# Patient Record
Sex: Female | Born: 1960
Health system: Southern US, Community
[De-identification: ages and names within clinical notes are randomized; demographics above are authoritative.]

## PROBLEM LIST (undated history)

## (undated) DIAGNOSIS — F419 Anxiety disorder, unspecified: Secondary | ICD-10-CM

## (undated) DIAGNOSIS — F909 Attention-deficit hyperactivity disorder, unspecified type: Secondary | ICD-10-CM

## (undated) HISTORY — DX: Anxiety disorder, unspecified: F41.9

## (undated) HISTORY — DX: Attention-deficit hyperactivity disorder, unspecified type: F90.9

---

## 1998-01-22 ENCOUNTER — Other Ambulatory Visit: Admission: RE | Admit: 1998-01-22 | Discharge: 1998-01-22 | Payer: Self-pay | Admitting: *Deleted

## 1998-03-19 ENCOUNTER — Other Ambulatory Visit: Admission: RE | Admit: 1998-03-19 | Discharge: 1998-03-19 | Payer: Self-pay | Admitting: Obstetrics and Gynecology

## 1998-04-09 ENCOUNTER — Inpatient Hospital Stay (HOSPITAL_COMMUNITY): Admission: AD | Admit: 1998-04-09 | Discharge: 1998-04-11 | Payer: Self-pay | Admitting: *Deleted

## 1998-04-24 ENCOUNTER — Encounter (HOSPITAL_COMMUNITY): Admission: RE | Admit: 1998-04-24 | Discharge: 1998-07-23 | Payer: Self-pay | Admitting: *Deleted

## 1998-05-14 ENCOUNTER — Other Ambulatory Visit: Admission: RE | Admit: 1998-05-14 | Discharge: 1998-05-14 | Payer: Self-pay | Admitting: Obstetrics and Gynecology

## 1999-07-13 ENCOUNTER — Other Ambulatory Visit: Admission: RE | Admit: 1999-07-13 | Discharge: 1999-07-13 | Payer: Self-pay | Admitting: Obstetrics and Gynecology

## 2000-02-17 ENCOUNTER — Inpatient Hospital Stay (HOSPITAL_COMMUNITY): Admission: AD | Admit: 2000-02-17 | Discharge: 2000-02-19 | Payer: Self-pay | Admitting: Obstetrics and Gynecology

## 2000-02-17 ENCOUNTER — Inpatient Hospital Stay (HOSPITAL_COMMUNITY): Admission: AD | Admit: 2000-02-17 | Discharge: 2000-02-17 | Payer: Self-pay | Admitting: Obstetrics and Gynecology

## 2000-03-24 ENCOUNTER — Other Ambulatory Visit: Admission: RE | Admit: 2000-03-24 | Discharge: 2000-03-24 | Payer: Self-pay | Admitting: Obstetrics and Gynecology

## 2001-03-28 ENCOUNTER — Other Ambulatory Visit: Admission: RE | Admit: 2001-03-28 | Discharge: 2001-03-28 | Payer: Self-pay | Admitting: Obstetrics and Gynecology

## 2001-04-27 ENCOUNTER — Encounter: Payer: Self-pay | Admitting: Obstetrics and Gynecology

## 2001-04-27 ENCOUNTER — Encounter: Admission: RE | Admit: 2001-04-27 | Discharge: 2001-04-27 | Payer: Self-pay | Admitting: Obstetrics and Gynecology

## 2001-05-03 ENCOUNTER — Other Ambulatory Visit: Admission: RE | Admit: 2001-05-03 | Discharge: 2001-05-03 | Payer: Self-pay | Admitting: Obstetrics and Gynecology

## 2001-05-03 ENCOUNTER — Encounter (INDEPENDENT_AMBULATORY_CARE_PROVIDER_SITE_OTHER): Payer: Self-pay

## 2001-10-24 ENCOUNTER — Other Ambulatory Visit: Admission: RE | Admit: 2001-10-24 | Discharge: 2001-10-24 | Payer: Self-pay | Admitting: Obstetrics and Gynecology

## 2002-07-11 ENCOUNTER — Inpatient Hospital Stay (HOSPITAL_COMMUNITY): Admission: AD | Admit: 2002-07-11 | Discharge: 2002-07-11 | Payer: Self-pay | Admitting: Obstetrics and Gynecology

## 2002-07-15 ENCOUNTER — Inpatient Hospital Stay (HOSPITAL_COMMUNITY): Admission: AD | Admit: 2002-07-15 | Discharge: 2002-07-15 | Payer: Self-pay | Admitting: Obstetrics & Gynecology

## 2002-07-16 ENCOUNTER — Inpatient Hospital Stay (HOSPITAL_COMMUNITY): Admission: AD | Admit: 2002-07-16 | Discharge: 2002-07-18 | Payer: Self-pay | Admitting: *Deleted

## 2002-08-27 ENCOUNTER — Other Ambulatory Visit: Admission: RE | Admit: 2002-08-27 | Discharge: 2002-08-27 | Payer: Self-pay | Admitting: Obstetrics and Gynecology

## 2003-04-10 ENCOUNTER — Other Ambulatory Visit: Admission: RE | Admit: 2003-04-10 | Discharge: 2003-04-10 | Payer: Self-pay | Admitting: Obstetrics and Gynecology

## 2003-09-05 ENCOUNTER — Other Ambulatory Visit: Admission: RE | Admit: 2003-09-05 | Discharge: 2003-09-05 | Payer: Self-pay | Admitting: Obstetrics and Gynecology

## 2004-01-30 ENCOUNTER — Encounter: Admission: RE | Admit: 2004-01-30 | Discharge: 2004-01-30 | Payer: Self-pay | Admitting: Obstetrics and Gynecology

## 2005-05-14 ENCOUNTER — Encounter: Admission: RE | Admit: 2005-05-14 | Discharge: 2005-05-14 | Payer: Self-pay | Admitting: Obstetrics and Gynecology

## 2006-07-15 ENCOUNTER — Encounter: Admission: RE | Admit: 2006-07-15 | Discharge: 2006-07-15 | Payer: Self-pay | Admitting: Obstetrics and Gynecology

## 2007-09-01 ENCOUNTER — Encounter: Admission: RE | Admit: 2007-09-01 | Discharge: 2007-09-01 | Payer: Self-pay | Admitting: Obstetrics and Gynecology

## 2008-06-22 IMAGING — MG MM DIGITAL SCREENING
4 series · 4 of 4 positions shown · non-contrast
Comparison: Prior studies.

DG SCREEN MAMMOGRAM BILATERAL
Bilateral CC and MLO view(s) were taken.
Prior study comparison: July 15, 2006, dG screen mammogram bilateral, performed at [facility] [REDACTED].  May 14, 2005, bilateral screening mammogram, performed at [facility] 
[REDACTED].

DIGITAL SCREENING MAMMOGRAM WITH CAD:

[R CC]
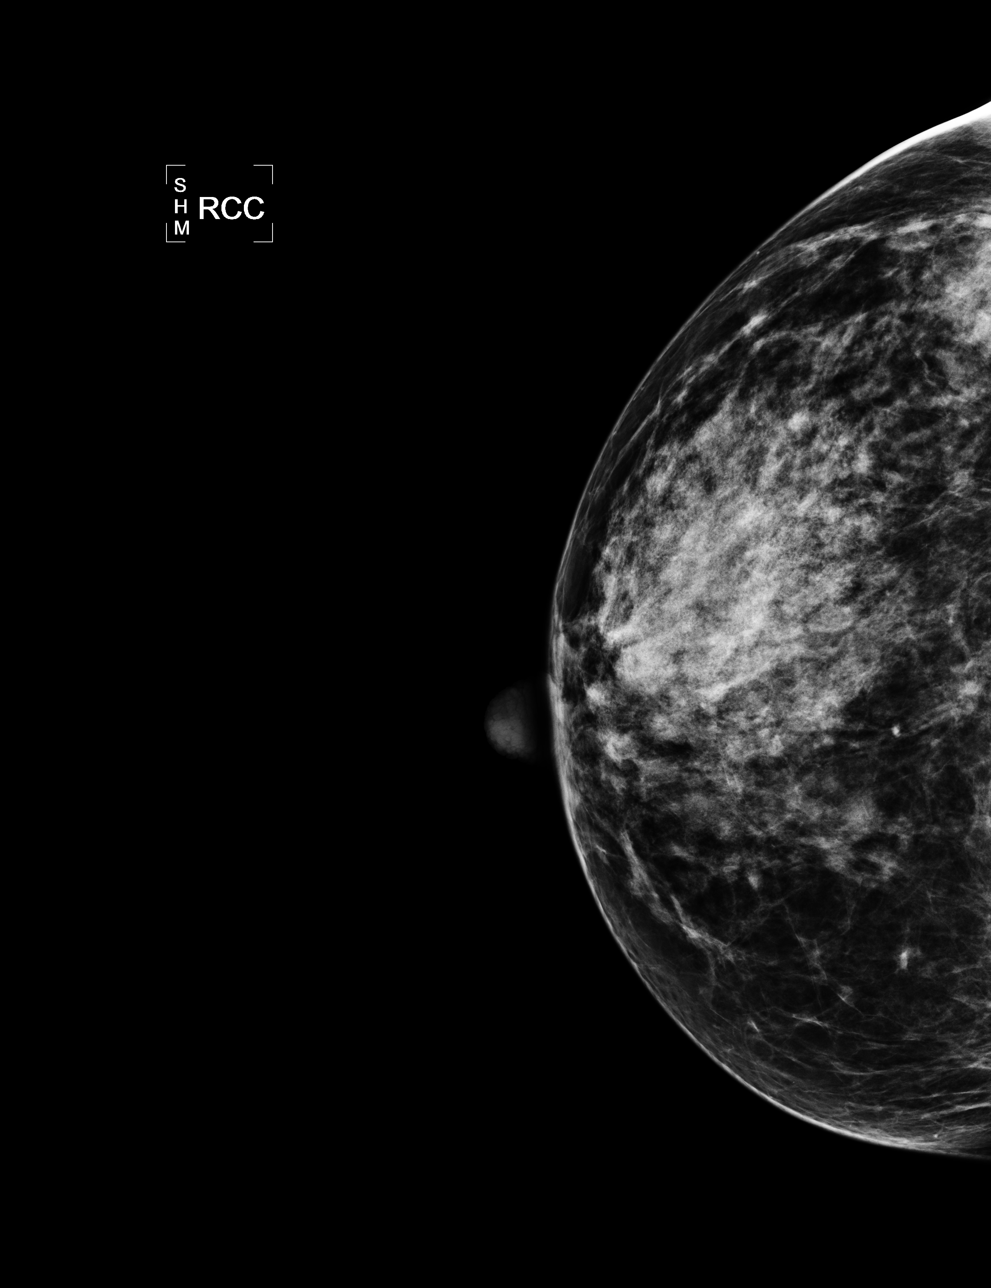

[R MLO]
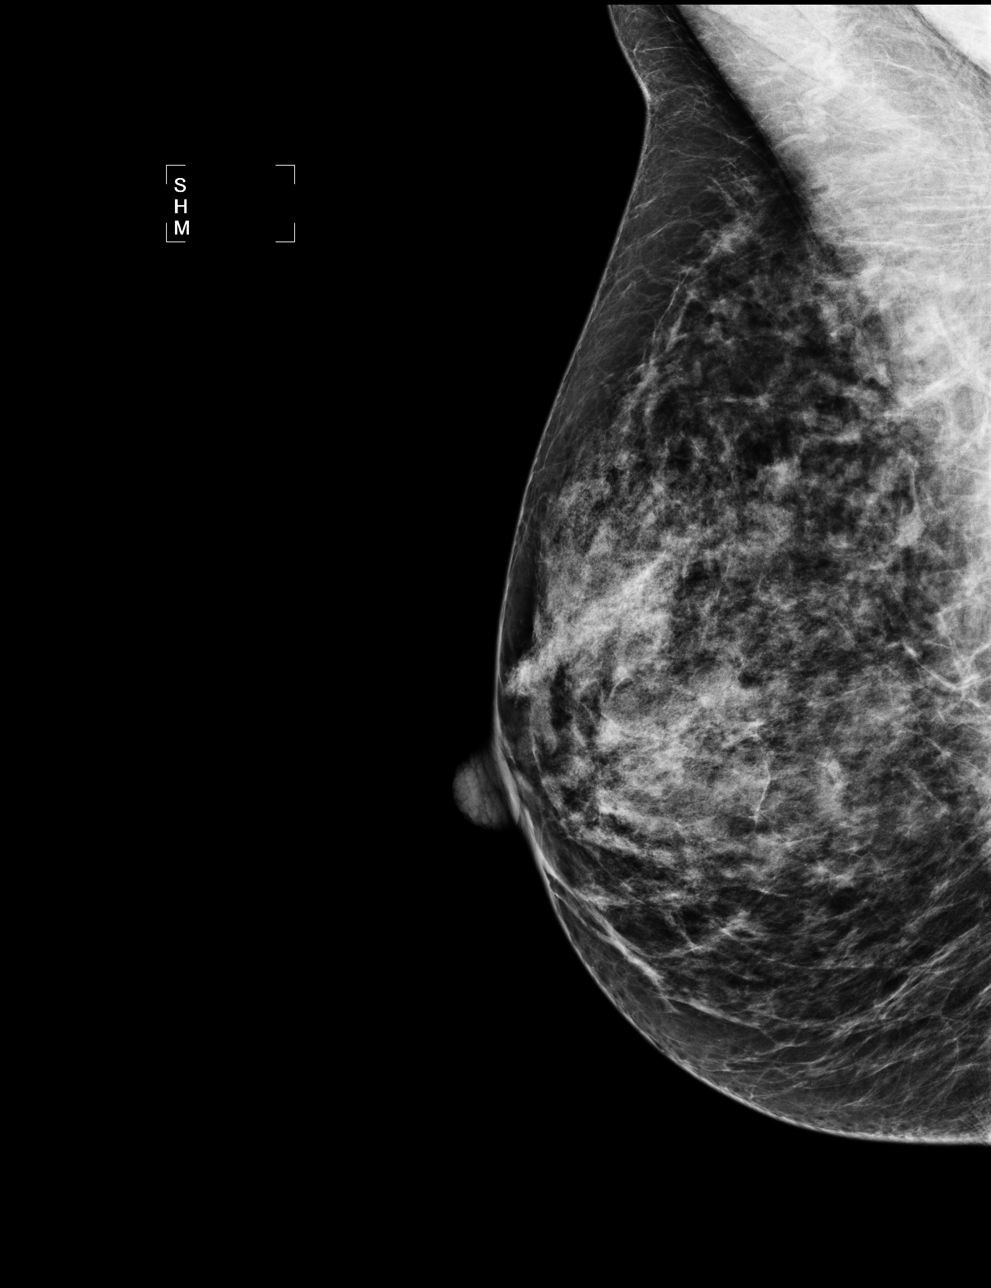

[L CC]
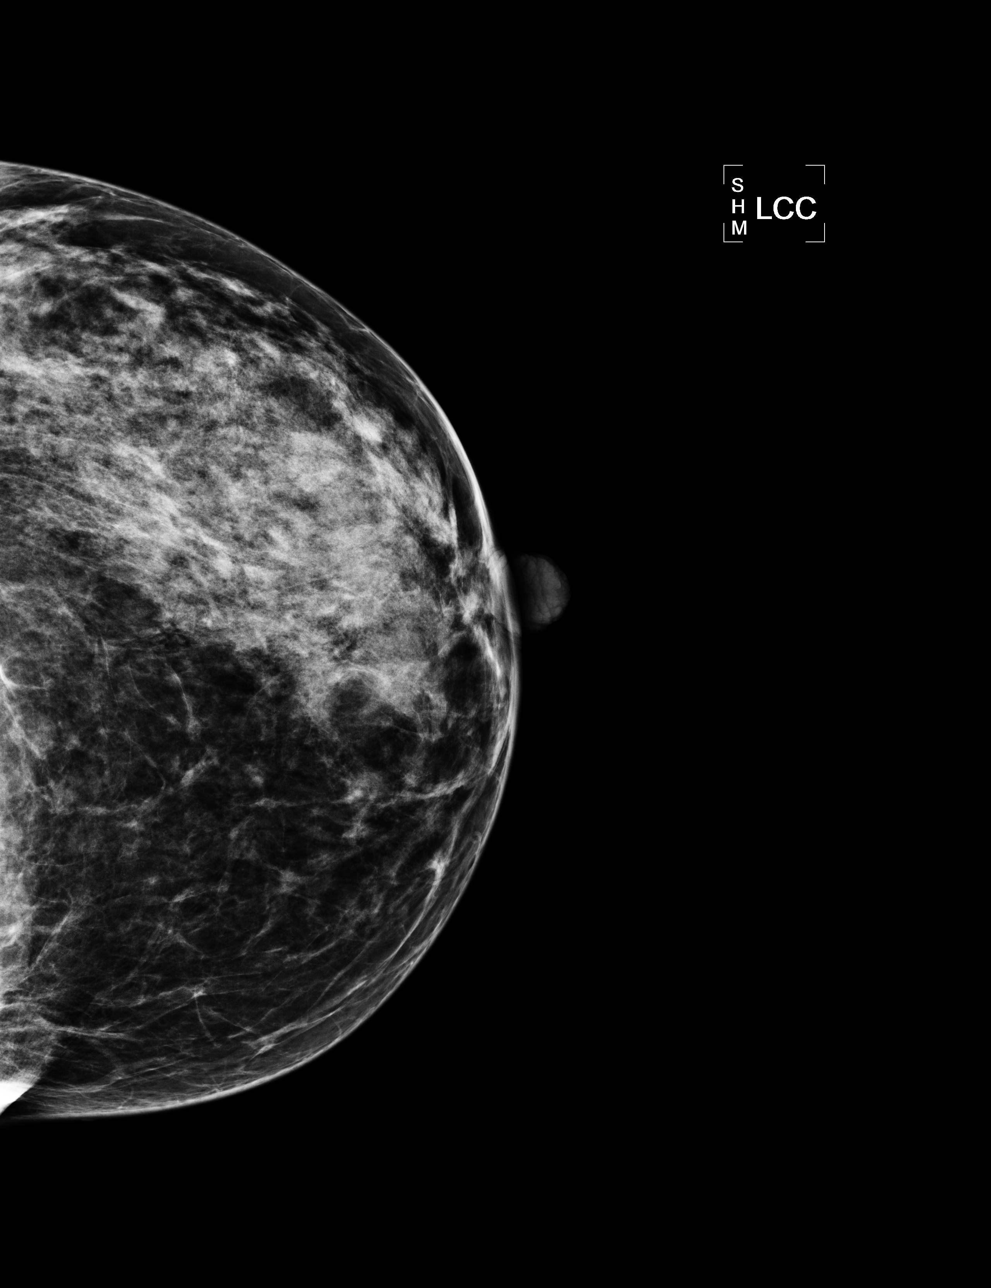

[L MLO]
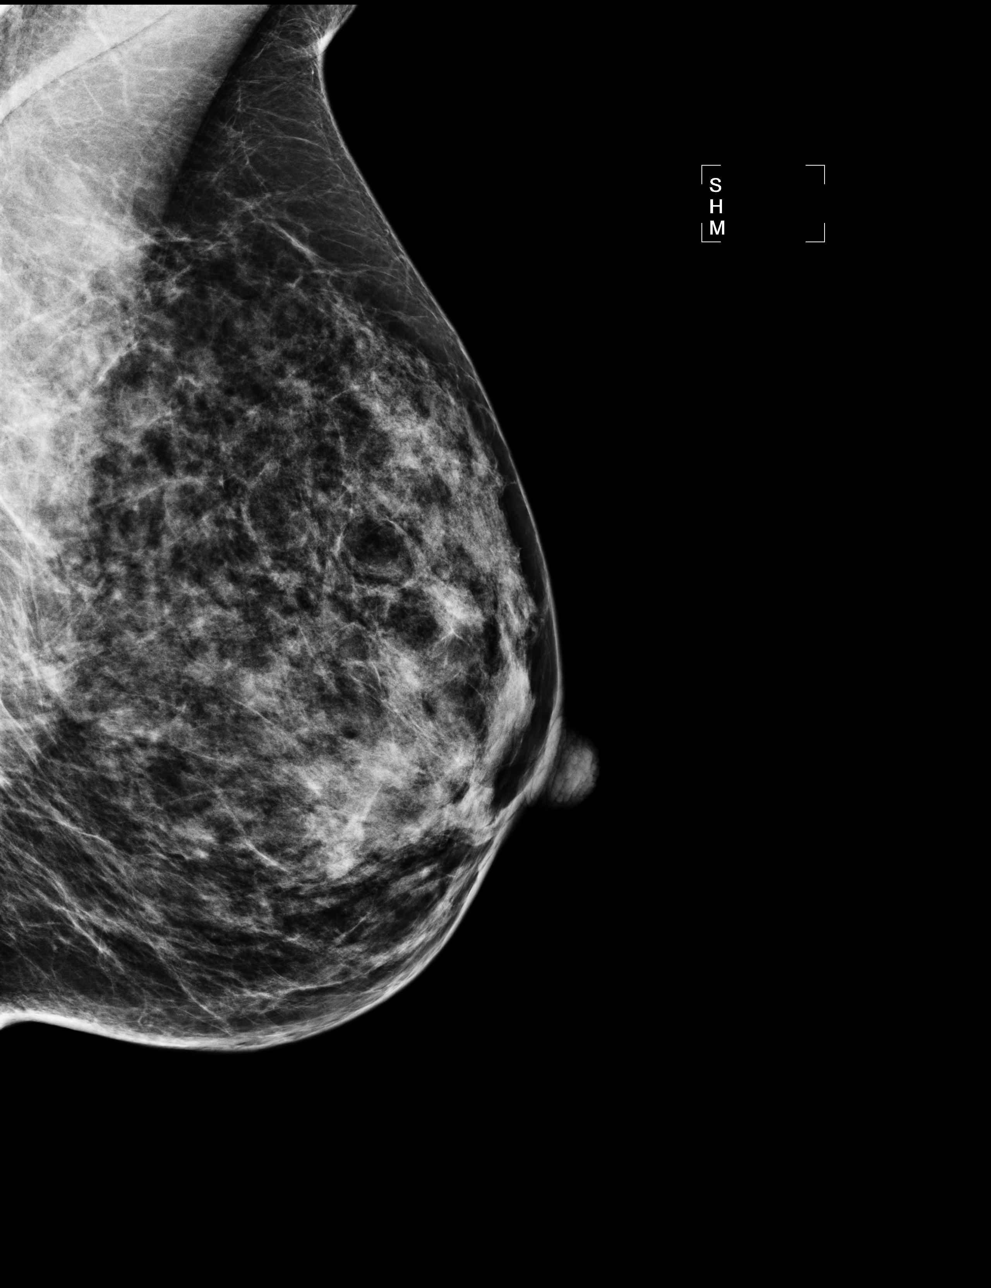

[4 of 4 positions shown; findings below may reference images not displayed]

The breast tissue is heterogeneously dense.  There is no dominant mass, architectural distortion or
calcification to suggest malignancy.
IMPRESSION: No mammographic evidence of malignancy.  Suggest yearly screening mammography.

ASSESSMENT: Negative - BI-RADS 1

Screening mammogram in 1 year.
THIS WAS ANALAYZED BY COMPUTER AIDED DETECTION. , THIS PROCEDURE WAS A DIGITAL MAMMOGRAM.

## 2011-02-17 ENCOUNTER — Ambulatory Visit (HOSPITAL_COMMUNITY): Payer: Self-pay | Admitting: Psychiatry

## 2011-02-24 ENCOUNTER — Ambulatory Visit (HOSPITAL_COMMUNITY): Payer: Self-pay | Admitting: Psychiatry

## 2011-03-05 NOTE — H&P (Signed)
   NAME:  Kayla Sims, Kayla Sims                         ACCOUNT NO.:  [id number]   MEDICAL RECORD NO.:  [id number]                   PATIENT TYPE:  INP   LOCATION:  9173                                 FACILITY:  [facility]   PHYSICIAN:  Lenoard Aden, M.D.             DATE OF BIRTH:  02-Jan-1961   DATE OF ADMISSION:  07/16/2002  DATE OF DISCHARGE:                                HISTORY & PHYSICAL   CHIEF COMPLAINT:  Labor.   HISTORY OF PRESENT ILLNESS:  The patient is a 50 year old white female G3,  P2, EDD July 19, 2002 with regular contractions.   PAST MEDICAL HISTORY:  Remarkable for two uncomplicated vaginal deliveries.   ALLERGIES:  None.   MEDICATIONS:  Prenatal vitamins.   PAST SURGICAL HISTORY:  History of no other medical or surgical  hospitalizations.  History of LEEP for dysplasia in July 2002.   FAMILY HISTORY:  Insulin-dependent diabetes mellitus, leukemia, breast and  uterine cancer.   PRENATAL LABORATORY DATA:  Blood type A+.  Rh antibody negative.  Rubella  immune.  Hepatitis B surface antigen negative.  GBS negative.   PHYSICAL EXAMINATION:  GENERAL:  Well-developed, well-nourished white female  in minimal amount of distress.  HEENT:  Normal.  LUNGS:  Clear.  HEART:  Regular rate and rhythm.  ABDOMEN:  Soft, gravid, nontender.  Estimated fetal weight 8.5 pounds.  PELVIC:  Cervix was 3 cm, 80% vertex, -1/-2.  EXTREMITIES:  No cords.  NEUROLOGIC:  Nonfocal.   IMPRESSION:  Prodromal labor at 39+ weeks.   PLAN:  [facility] for AROM and induction.                                               Lenoard Aden, M.D.    RJT/MEDQ  D:  07/16/2002  T:  07/16/2002  Job:  [id number]

## 2011-03-29 ENCOUNTER — Ambulatory Visit (HOSPITAL_COMMUNITY): Payer: Self-pay | Admitting: Psychiatry

## 2011-04-02 ENCOUNTER — Ambulatory Visit (HOSPITAL_COMMUNITY): Payer: BC Managed Care – PPO | Admitting: Psychiatry

## 2011-04-02 DIAGNOSIS — F988 Other specified behavioral and emotional disorders with onset usually occurring in childhood and adolescence: Secondary | ICD-10-CM

## 2011-04-06 NOTE — Group Therapy Note (Signed)
NAMEAMARIANA, Kayla Sims               ACCOUNT NO.:  [id number]  MEDICAL RECORD NO.:  [id number]  LOCATION:  [facility]                           [facility]ACILITY:  [facility]  PHYSICIAN:  Michelangelo Rindfleisch T. Jabree Rebert, M.D.   DATE OF BIRTH:  11/03/1960                                INITIAL PROGRESS NOTE    The patient is 50 year old Caucasian, employed, married Kayla who is self-referred seeking evaluation and treatment.  The patient endorsed history of ADD symptoms most of her life.  However, in the past few months, she believes that her symptoms are getting worse.  She admitted forgetting things, lack of attention and focus, taking more time to complete the task.  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  She is afraid that she may fail and decided that next semester or class, she will only do the part-time as she cannot accept failure in her current job.  She admitted she was easily distracted and difficulty doing multitasking.  She endorsed that sometimes she has a lot of racing thoughts in her head and she cannot focus on one thing.  That makes her frustrated, tearful and tired.  She denies any depressive thoughts and reported that she has a beautiful family, loving and supporting husband, good support system, and three children.  However, she gets frustrated when things are not done accordingly.  She also endorsed lack of organization to do job and takes more than normal time to finish the task.  She remembered taking Adderall almost 15 years ago through mental health provider.  She took it for some time and then she stopped due to the fact that it caused some hyperactivity.  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  The patient denies any suicidal thinking, mania, hypomania psychosis, violence, aggression or agitation.  PAST PSYCHIATRIC HISTORY: As mentioned above, the patient has history of  seeing a mental health provider almost 15 years ago for the same reason where she was given Adderall but she stopped Adderall due to it making her more hyper.  It did help the focus and attention but she does not want to take anything that can make her more hyper.  She also has taken Xanax 10 years ago for anxiety but currently she is not taking any psychotropic medication. She admitted she has difficulty with attention and concentration in the school but she was able to manage good scores with hard work and putting more hours and studying.  FAMILY HISTORY: The patient believed that her mother and her brother may have ADD symptoms.   PSYCHOSOCIAL HISTORY: The patient was born in [place] and  lived in [place] area until age 10 when she moved to [place].  She has been married for 15 years; she has three children; two sons and one daughter. Her husband has been very supportive, however, busy in his job.  The patient denies any history of physical, sexual, verbal or emotional abuse.  EDUCATION BACKGROUND AND WORK HISTORY: Patient has BS in sociology and bachelor's in education from [employer].  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 Alcohol and substance abuse history.  The patient does admit history of one to two glasses of wine on a regular basis.  She has been drinking for the past 15 years.  However, she denies any history of withdrawal symptoms, blackouts, seizures or any intoxication.  She admitted that she drinks to calm herself the night when she is tired of thinking too much.  She denies any other illegal substances.  MEDICAL HISTORY: The patient has history of anemia.  She sees Dr. Lum [facility] at [facility].  However, she currently is not taking any prescribed medication.  She takes iron over-the-counter.  Her weight today was 131 pounds.  MENTAL STATUS EXAM: The patient is a middle-aged Kayla who is groomed,  well dressed and maintained good eye contact.  She appears very anxious but overall relevant and pleasant.  Her speech is soft, clear and coherent.  Her thought processes are also linear, logical and goal directed.  At times she has difficulty paying attention in the conversation and she keeps reading her book.  Tries to address her symptoms.  She denies any auditory hallucinations, suicidal thoughts or homicidal thoughts.  She is alert and oriented x3 but, as mentioned above, she has difficulty at times with attention and concentration.  There was no psychosis present. She also has some difficulty recalling old events but overall her memory was okay.  She is alert and oriented x3.  Her insight, judgment, pulse control were okay.  DIAGNOSES: AXIS I:  Attention deficit disorder, anxiety disorder NOS. AXIS II:  Deferred. AXIS III:  See medical history. AXIS IV:  Mild. AXIS V:  70-75.  PLAN: I talked to the patient at length about stimulants and medicine for ADD. We also talked at length about continued use of alcohol and interaction with ADD medication.  After some discussion, the patient agreed to stop the alcohol to see if any of the ADD medicine can help her symptoms. Due to the fact that she had tried Adderall in the past that makes her hyper, we discussed to try Strattera in a small dose.  I have explained in length the risks and benefits of medication.  She has agreed to try Strattera in a smaller dose 18 mg for 2 weeks and then she will take 25 mg daily.  She will stop drinking alcohol for now to see if the medicine helps her ADD symptoms.  Will also get collateral information from her primary care doctor for any labs if it has been done in the past few months.  I recommended to give [place] a call if she has any question or any concern about the medication.  I will see her again in 2 weeks.     Kayla Sims T. Kayla Sims, M.D.     STA/MEDQ  D:  04/02/2011  T:  04/02/2011  Job:   [id number]  Electronically Signed by Kathryne Sharper M.D. on 04/06/2011 01:59:14 PM

## 2011-05-03 ENCOUNTER — Encounter (HOSPITAL_COMMUNITY): Payer: BC Managed Care – PPO | Admitting: Psychiatry

## 2011-05-03 DIAGNOSIS — F988 Other specified behavioral and emotional disorders with onset usually occurring in childhood and adolescence: Secondary | ICD-10-CM

## 2011-05-10 ENCOUNTER — Encounter (HOSPITAL_COMMUNITY): Payer: BC Managed Care – PPO | Admitting: Psychiatry

## 2011-05-19 ENCOUNTER — Encounter (HOSPITAL_COMMUNITY): Payer: BC Managed Care – PPO | Admitting: Psychiatry

## 2011-09-27 ENCOUNTER — Ambulatory Visit: Payer: BC Managed Care – PPO | Admitting: Family Medicine

## 2011-11-23 ENCOUNTER — Ambulatory Visit (INDEPENDENT_AMBULATORY_CARE_PROVIDER_SITE_OTHER): Payer: BC Managed Care – PPO | Admitting: Family Medicine

## 2011-11-23 ENCOUNTER — Encounter: Payer: Self-pay | Admitting: Family Medicine

## 2011-11-23 VITALS — BP 110/60 | HR 63 | Temp 97.8°F | Ht 67.0 in | Wt 135.4 lb

## 2011-11-23 DIAGNOSIS — Z1211 Encounter for screening for malignant neoplasm of colon: Secondary | ICD-10-CM

## 2011-11-23 DIAGNOSIS — F341 Dysthymic disorder: Secondary | ICD-10-CM

## 2011-11-23 DIAGNOSIS — F418 Other specified anxiety disorders: Secondary | ICD-10-CM

## 2011-11-23 MED ORDER — FLUOXETINE HCL 10 MG PO CAPS: 10.0000 mg | ORAL_CAPSULE | Freq: Every day | ORAL | Status: DC

## 2011-11-23 NOTE — Patient Instructions (Signed)
It was wonderful to meet you. Please call in 3-4 weeks with an update of your symptoms.    Fluoxetine capsules or tablets (Depression/Mood Disorders) What is this medicine? FLUOXETINE (floo OX e teen) belongs to a class of drugs known as selective serotonin reuptake inhibitors (SSRIs). It helps to treat mood problems such as depression, obsessive compulsive disorder, and panic attacks. It can also treat certain eating disorders. This medicine may be used for other purposes; ask your health care provider or pharmacist if you have questions. What should I tell my health care provider before I take this medicine? They need to know if you have any of these conditions: -bipolar disorder or mania -diabetes -glaucoma -liver disease -psychosis -seizures -suicidal thoughts or history of attempted suicide -an unusual or allergic reaction to fluoxetine, other medicines, foods, dyes, or preservatives -pregnant or trying to get pregnant -breast-feeding How should I use this medicine? Take this medicine by mouth with a glass of water. Follow the directions on the prescription label. You can take this medicine with or without food. Take your medicine at regular intervals. Do not take it more often than directed. Do not stop taking except on your doctor's advice. A special MedGuide will be given to you by the pharmacist with each prescription and refill. Be sure to read this information carefully each time. Talk to your pediatrician regarding the use of this medicine in children. While this drug may be prescribed for children as young as 7 years for selected conditions, precautions do apply. Overdosage: If you think you have taken too much of this medicine contact a poison control center or emergency room at once. NOTE: This medicine is only for you. Do not share this medicine with others. What if I miss a dose? If you miss a dose, skip the missed dose and go back to your regular dosing schedule. Do not  take double or extra doses. What may interact with this medicine? Do not take fluoxetine with any of the following medications: -other medicines containing fluoxetine, like Sarafem or Symbyax -certain diet drugs like dexfenfluramine, fenfluramine, phentermine -cisapride -linezolid -medicines called MAO Inhibitors like Azilect, Carbex, Eldepryl, Marplan, Nardil, and Parnate -methylene blue -pimozide -procarbazine -thioridazine -tryptophan Fluoxetine may also interact with the following medications: -alcohol -any other medicines for depression, anxiety, or psychotic disturbances -aspirin and aspirin-like medicines -carbamazepine -cyproheptadine -dextromethorphan -flecainide -lithium -medicines for diabetes -medicines for migraine headache, like sumatriptan -medicines for sleep -medicines that treat or prevent blood clots like warfarin, enoxaparin, and dalteparin -metoprolol -NSAIDs, medicines for pain and inflammation, like ibuprofen or naproxen -phenytoin -propafenone -propranolol -St. John's wort -vinblastine This list may not describe all possible interactions. Give your health care provider a list of all the medicines, herbs, non-prescription drugs, or dietary supplements you use. Also tell them if you smoke, drink alcohol, or use illegal drugs. Some items may interact with your medicine. What should I watch for while using this medicine? Visit your doctor or health care professional for regular checks on your progress. Continue to take your medicine even if you do not immediately feel better. It can take several weeks before you notice the full effect of this medicine. Patients and their families should watch out for worsening depression or thoughts of suicide. Also watch out for any sudden or severe changes in feelings such as feeling anxious, agitated, panicky, irritable, hostile, aggressive, impulsive, severely restless, overly excited and hyperactive, or not being able to  sleep. If this happens, especially at the beginning of treatment  or after a change in dose, call your doctor. You may get drowsy or dizzy. Do not drive, use machinery, or do anything that needs mental alertness until you know how this medicine affects you. Do not stand or sit up quickly, especially if you are an older patient. This reduces the risk of dizzy or fainting spells. Alcohol can make you more drowsy and dizzy. Avoid alcoholic drinks. Your mouth may get dry. Chewing sugarless gum or sucking hard candy, and drinking plenty of water may help. Contact your doctor if the problem does not go away or is severe. If you have diabetes, this medicine may affect blood sugar levels. Check your blood sugar. Talk to your doctor or health care professional if you notice changes. If you have been taking this medicine regularly for some time, do not suddenly stop taking it. You must gradually reduce the dose or you may get side effects or have a worsening of your condition. Ask your doctor or health care professional for advice. Do not treat yourself for coughs, colds or allergies without asking your doctor or health care professional for advice. Some ingredients can increase possible side effects. What side effects may I notice from receiving this medicine? Side effects that you should report to your doctor or health care professional as soon as possible: -allergic reactions like skin rash, itching or hives, swelling of the face, lips, or tongue -breathing problems -confusion -fast or irregular heart rate, palpitations -flu-like fever, chills, cough, muscle or joint aches and pains -seizures -suicidal thoughts or other mood changes -tremors -trouble sleeping -unusual bleeding or bruising -unusually tired or weak -vomiting Side effects that usually do not require medical attention (report to your doctor or health care professional if they continue or are bothersome): -blurred vision -change in sex  drive or performance -diarrhea -dry mouth -flushing -headache -increased or decreased appetite -nausea -sweating This list may not describe all possible side effects. Call your doctor for medical advice about side effects. You may report side effects to FDA at 1-800-FDA-1088. Where should I keep my medicine? Keep out of the reach of children. Store at room temperature between 15 and 30 degrees C (59 and 86 degrees F). Throw away any unused medicine after the expiration date. NOTE: This sheet is a summary. It may not cover all possible information. If you have questions about this medicine, talk to your doctor, pharmacist, or health care provider.  2012, Elsevier/Gold Standard. (05/15/2010 3:23:25 PM)

## 2011-11-23 NOTE — Progress Notes (Signed)
  Subjective:    Patient ID: Kayla Sims, female    DOB: 02-15-1961, 51 y.o.   MRN: [id number]  HPI  51 yo G3P3 here to establish care.  Depression/anxiety- over past year, increased anxiety and irritability.  A little more tearful as well. Dr. Renaldo Fiddler (GYN) placed her on celexa but that made her feel "funny" and clinch her jaw. Work has been more stressful and 3 kids are very involved in sports and activities. No SI or HI. Has a supportive husband.  Never had a colonoscopy.  Per pt, mammogram and pap smear UTD (has GYN).  Patient Active Problem List  Diagnoses  . Depression with anxiety   No past medical history on file. No past surgical history on file. History  Substance Use Topics  . Smoking status: Never Smoker   . Smokeless tobacco: Not on file  . Alcohol Use: Not on file   Family History  Problem Relation Age of Onset  . Heart disease Mother   . Cancer Sister 80    uterine   No Known Allergies No current outpatient prescriptions on file prior to visit.   The PMH, PSH, Social History, Family History, Medications, and allergies have been reviewed in [facility], and have been updated if relevant.   Review of Systems See HPI    Objective:   Physical Exam BP 110/60  Pulse 63  Temp(Src) 97.8 F (36.6 C) (Oral)  Ht 5\' 7"  (1.702 m)  Wt 135 lb 6.4 oz (61.417 kg)  BMI 21.21 kg/m2  SpO2 99%  General:  Well-developed,well-nourished,in no acute distress; alert,appropriate and cooperative throughout examination Head:  normocephalic and atraumatic.   Eyes:  vision grossly intact, pupils equal, pupils round, and pupils reactive to light.   Ears:  R ear normal and L ear normal.   Nose:  no external deformity.   Mouth:  good dentition.   ungs:  Normal respiratory effort, chest expands symmetrically. Lungs are clear to auscultation, no crackles or wheezes. Heart:  Normal rate and regular rhythm. S1 and S2 normal without gallop, murmur, click, rub or other extra  sounds. Psych:  Cognition and judgment appear intact. Alert and cooperative with normal attention span and concentration. No apparent delusions, illusions, hallucinations        Assessment & Plan:   1. Depression with anxiety  >30 min spent with face to face with patient, >50% counseling and/or coordinating care.  Will start prozac 10 mg daily. See pt instructions for details.     2. Screening for colon cancer  Ambulatory referral to Gastroenterology

## 2011-11-25 ENCOUNTER — Telehealth: Payer: Self-pay | Admitting: Family Medicine

## 2011-11-26 NOTE — Telephone Encounter (Signed)
Spoke with patient and she stated that she has noticed a "flutter" in her chest but she stated it was up high near her collar bone.  She stated that she has no SOB, no wheezing, no chest pain, no palpitations, no increased heart rate.  She was just recently put on Prozac and she thinks maybe if could be a symptoms associated with the new medication.  She stated that she will continue to monitor her symptoms and if anything different or new happens she will let us know.

## 2011-11-29 ENCOUNTER — Encounter: Payer: Self-pay | Admitting: Internal Medicine

## 2011-12-02 NOTE — Telephone Encounter (Signed)
Then I would stop it and then f/u with PCP.

## 2011-12-02 NOTE — Telephone Encounter (Signed)
Patient was advised.  Dr. Dayton Martes is out of the office until 12/13/11.  Patient was advised of this and says she will wait until then to hear from Dr. Dayton Martes.  She wants to see if she will switch her to another medication or if she will have to come back in for an appt.  Advised patient that if she feels she needs to be on another med prior to that, please call in for an appt with another physician.

## 2011-12-02 NOTE — Telephone Encounter (Signed)
Patient calling back stating that she's still having issues with the prozac, that she's not comfortable taking it anymore. Please advise.

## 2011-12-12 NOTE — Telephone Encounter (Signed)
She has now had an adverse rxn to prozac and celexa. Let's try buspar- please send in rx for buspar 15 mg twice daily, #60 with 1 refill to her pharmacy. Ok to cut tablets in half and take 7.5 mg twice daily for first few days to minimize side effects. Keep [place] posted!

## 2011-12-13 ENCOUNTER — Other Ambulatory Visit: Payer: BC Managed Care – PPO | Admitting: Internal Medicine

## 2011-12-13 NOTE — Telephone Encounter (Signed)
Left message asking that patient call back 

## 2011-12-14 MED ORDER — BUSPIRONE HCL 15 MG PO TABS: 15.0000 mg | ORAL_TABLET | Freq: Two times a day (BID) | ORAL | Status: DC

## 2011-12-14 NOTE — Telephone Encounter (Signed)
Spoke with patient and advised her as instructed.  She has follow up appt in one week and I advised her to keep that.  Medicine called to pharmacy.

## 2011-12-20 ENCOUNTER — Telehealth: Payer: Self-pay | Admitting: *Deleted

## 2011-12-20 NOTE — Telephone Encounter (Signed)
Yes she can try to take it three times daily.

## 2011-12-20 NOTE — Telephone Encounter (Signed)
Patient called to let Dr. Dayton Martes know that she was taking Buspar 15mg  1/2 tablet by mouth twice daily and was suppose to increase the dose to 1 tablet twice a day.  After increasing the dose to 1 tablet twice daily patient stated that she is feeling dizzy.  She wants to know if she can take 1/2 tablet three times daily to see if the dizziness goes away?  Please advise.

## 2011-12-20 NOTE — Telephone Encounter (Signed)
Advised patient

## 2011-12-21 ENCOUNTER — Ambulatory Visit: Payer: BC Managed Care – PPO | Admitting: Family Medicine

## 2011-12-28 ENCOUNTER — Encounter: Payer: BC Managed Care – PPO | Admitting: Internal Medicine

## 2012-01-04 ENCOUNTER — Ambulatory Visit (INDEPENDENT_AMBULATORY_CARE_PROVIDER_SITE_OTHER): Payer: BC Managed Care – PPO | Admitting: Family Medicine

## 2012-01-04 DIAGNOSIS — F341 Dysthymic disorder: Secondary | ICD-10-CM

## 2012-01-04 NOTE — Progress Notes (Signed)
hpi

## 2012-01-11 ENCOUNTER — Ambulatory Visit (INDEPENDENT_AMBULATORY_CARE_PROVIDER_SITE_OTHER): Payer: BC Managed Care – PPO | Admitting: Family Medicine

## 2012-01-11 ENCOUNTER — Encounter: Payer: Self-pay | Admitting: Family Medicine

## 2012-01-11 VITALS — BP 118/72 | HR 76 | Temp 98.4°F | Wt 136.0 lb

## 2012-01-11 DIAGNOSIS — F341 Dysthymic disorder: Secondary | ICD-10-CM

## 2012-01-11 DIAGNOSIS — F418 Other specified anxiety disorders: Secondary | ICD-10-CM

## 2012-01-11 MED ORDER — BUSPIRONE HCL 15 MG PO TABS: 7.5000 mg | ORAL_TABLET | Freq: Three times a day (TID) | ORAL | Status: AC

## 2012-01-11 MED ORDER — ALPRAZOLAM 0.25 MG PO TABS: 0.2500 mg | ORAL_TABLET | Freq: Every evening | ORAL | Status: DC | PRN

## 2012-01-11 NOTE — Progress Notes (Signed)
  Subjective:    Patient ID: Kayla Sims, female    DOB: 1961/04/03, 51 y.o.   MRN: [id number]  HPI  51 yo here for 1 month follow up.  Depression/anxiety- over past year, increased anxiety and irritability.  A little more tearful as well. Could not tolerate celexa or prozac- "funny" and clinch her jaw. Work has been more stressful and 3 kids are very involved in sports and activities. No SI or HI. Has a supportive husband.  Currently taking Buspar 7.5 mg three times daily. Feels less anxious and tearful but still feels like her "life is too crazy," no time for herself.   Patient Active Problem List  Diagnoses  . Depression with anxiety   No past medical history on file. No past surgical history on file. History  Substance Use Topics  . Smoking status: Never Smoker   . Smokeless tobacco: Not on file  . Alcohol Use: Not on file   Family History  Problem Relation Age of Onset  . Heart disease Mother   . Cancer Sister 60    uterine   No Known Allergies Current Outpatient Prescriptions on File Prior to Visit  Medication Sig Dispense Refill  . busPIRone (BUSPAR) 15 MG tablet Take 1 tablet (15 mg total) by mouth 2 (two) times daily.  60 tablet  1  . ferrous sulfate 325 (65 FE) MG tablet Take 325 mg by mouth daily with breakfast.      . FLUoxetine (PROZAC) 10 MG capsule Take 1 capsule (10 mg total) by mouth daily.  30 capsule  11  . Multiple Vitamin (MULTIVITAMIN) tablet Take 1 tablet by mouth daily.       The PMH, PSH, Social History, Family History, Medications, and allergies have been reviewed in [facility], and have been updated if relevant.   Review of Systems See HPI    Objective:   Physical Exam BP 118/72  Pulse 76  Temp(Src) 98.4 F (36.9 C) (Oral)  Wt 136 lb (61.689 kg)  General:  Well-developed,well-nourished,in no acute distress; alert,appropriate and cooperative throughout examination Head:  normocephalic and atraumatic.   Psych:  Cognition and judgment  appear intact. Alert and cooperative with normal attention span and concentration. No apparent delusions, illusions, hallucinations        Assessment & Plan:   1. Depression with anxiety  >30 min spent with face to face with patient, >50% counseling and/or coordinating care.   Continue buspar 7.5 mg tid, would not want to increase dose due to her sensitivity to medications. Advised psychotherapy- given Dr. Gordy Councilman information.

## 2012-01-11 NOTE — Patient Instructions (Signed)
Dr. Madelaine Etienne [address]  [place], [place] [address] [phone] Office [phone] Fax

## 2012-03-17 ENCOUNTER — Telehealth: Payer: Self-pay | Admitting: *Deleted

## 2012-03-17 NOTE — Telephone Encounter (Signed)
Patient is asking if it is all right for her to stop the medication that she is taking.  She doesn't like the way it is making her feel.  She wants to know if she can stop it abruptly.  Also says she thought she was supposed to be getting a referral to "talk to someone".  She hasn't heard anything yet.

## 2012-03-17 NOTE — Telephone Encounter (Signed)
Left message asking pt to call back. 

## 2012-03-17 NOTE — Telephone Encounter (Signed)
I didn't refer her because all we can do is give her names and phone numbers of therapists so I gave her Dr. Gavin Pound phone number and information (see pt information handout). We can make an appointment with Dr. Laymond Purser if she would like to see her since she is in our office. Ok to stop Buspar but she may feel better if she weans off- 1 tablet every other day for 1 week and then stop. She should make a follow up appt at her convenience so we can discuss other options.

## 2012-03-20 NOTE — Telephone Encounter (Signed)
Left message asking pt to call back. 

## 2012-03-20 NOTE — Telephone Encounter (Signed)
Advised Kayla Sims.  She says she has only been taking one half buspar a day, so I told her to just take one half every other day for a week and then stop.  She will look in to seeing Dr. Sherrine Maples and will call back for follow up here if needed.

## 2013-09-04 ENCOUNTER — Encounter: Payer: Self-pay | Admitting: Internal Medicine

## 2013-09-04 ENCOUNTER — Ambulatory Visit (INDEPENDENT_AMBULATORY_CARE_PROVIDER_SITE_OTHER): Payer: BC Managed Care – PPO | Admitting: Internal Medicine

## 2013-09-04 VITALS — BP 106/76 | HR 65 | Temp 97.7°F | Wt 134.5 lb

## 2013-09-04 DIAGNOSIS — L03011 Cellulitis of right finger: Secondary | ICD-10-CM

## 2013-09-04 DIAGNOSIS — Z23 Encounter for immunization: Secondary | ICD-10-CM

## 2013-09-04 DIAGNOSIS — L03019 Cellulitis of unspecified finger: Secondary | ICD-10-CM

## 2013-09-04 NOTE — Patient Instructions (Signed)
Paronychia Paronychia is an inflammatory reaction involving the folds of the skin surrounding the fingernail. This is commonly caused by an infection in the skin around a nail. The most common cause of paronychia is frequent wetting of the hands (as seen with bartenders, food servers, nurses or others who wet their hands). This makes the skin around the fingernail susceptible to infection by bacteria (germs) or fungus. Other predisposing factors are:  Aggressive manicuring.  Nail biting.  Thumb sucking. The most common cause is a staphylococcal (a type of germ) infection, or a fungal (Candida) infection. When caused by a germ, it usually comes on suddenly with redness, swelling, pus and is often painful. It may get under the nail and form an abscess (collection of pus), or form an abscess around the nail. If the nail itself is infected with a fungus, the treatment is usually prolonged and may require oral medicine for up to one year. Your caregiver will determine the length of time treatment is required. The paronychia caused by bacteria (germs) may largely be avoided by not pulling on hangnails or picking at cuticles. When the infection occurs at the tips of the finger it is called felon. When the cause of paronychia is from the herpes simplex virus (HSV) it is called herpetic whitlow. TREATMENT  When an abscess is present treatment is often incision and drainage. This means that the abscess must be cut open so the pus can get out. When this is done, the following home care instructions should be followed. HOME CARE INSTRUCTIONS   It is important to keep the affected fingers very dry. Rubber or plastic gloves over cotton gloves should be used whenever the hand must be placed in water.  Keep wound clean, dry and dressed as suggested by your caregiver between warm soaks or warm compresses.  Soak in warm water for fifteen to twenty minutes three to four times per day for bacterial infections. Fungal  infections are very difficult to treat, so often require treatment for long periods of time.  For bacterial (germ) infections take antibiotics (medicine which kill germs) as directed and finish the prescription, even if the problem appears to be solved before the medicine is gone.  Only take over-the-counter or prescription medicines for pain, discomfort, or fever as directed by your caregiver. SEEK IMMEDIATE MEDICAL CARE IF:  You have redness, swelling, or increasing pain in the wound.  You notice pus coming from the wound.  You have a fever.  You notice a bad smell coming from the wound or dressing. Document Released: 03/30/2001 Document Revised: 12/27/2011 Document Reviewed: 11/29/2008 [employer] Patient Information 2014 [employer].  

## 2013-09-04 NOTE — Progress Notes (Signed)
  Subjective:    Patient ID: Kayla Sims, female    DOB: 12-06-1960, 52 y.o.   MRN: [id number]  HPI  Pt presents to the clinic today with possible infection of right thumb. She noticed the redness and swelling 2 days ago. She did squeeze it last night and was able to get some pus out. She does feel like it is getting better. Additionally, she would like a flu shot today.  Review of Systems      History reviewed. No pertinent past medical history.  Current Outpatient Prescriptions  Medication Sig Dispense Refill  . ALPRAZolam (XANAX) 0.25 MG tablet Take 1 tablet (0.25 mg total) by mouth at bedtime as needed.  30 tablet  0  . ferrous sulfate 325 (65 FE) MG tablet Take 325 mg by mouth daily with breakfast.      . Multiple Vitamin (MULTIVITAMIN) tablet Take 1 tablet by mouth daily.      . [DISCONTINUED] FLUoxetine (PROZAC) 10 MG capsule Take 1 capsule (10 mg total) by mouth daily.  30 capsule  11   No current facility-administered medications for this visit.    Allergies  Allergen Reactions  . Sulfa Antibiotics Rash    Family History  Problem Relation Age of Onset  . Heart disease Mother   . Cancer Sister 84    uterine    History   Social History  . Marital Status: Married    Spouse Name: N/A    Number of Children: N/A  . Years of Education: N/A   Occupational History  . Not on file.   Social History Main Topics  . Smoking status: Never Smoker   . Smokeless tobacco: Not on file  . Alcohol Use: Not on file  . Drug Use: Not on file  . Sexual Activity: Not on file   Other Topics Concern  . Not on file   Social History Narrative  . No narrative on file     Constitutional: Denies fever, malaise, fatigue, headache or abrupt weight changes.  Skin: Denies rashes, lesions or ulcercations.   No other specific complaints in a complete review of systems (except as listed in HPI above).  Objective:   Physical Exam  BP 106/76  Pulse 65  Temp(Src) 97.7 F (36.5  C) (Oral)  Wt 134 lb 8 oz (61.009 kg)  SpO2 99% Wt Readings from Last 3 Encounters:  09/04/13 134 lb 8 oz (61.009 kg)  01/11/12 136 lb (61.689 kg)  11/23/11 135 lb 6.4 oz (61.417 kg)    General: Appears  herstated age, well developed, well nourished in NAD. Skin: Warm, dry and intact. No rashes, lesions or ulcerations noted. Paronychia noted of right thumb. Cardiovascular: Normal rate and rhythm. S1,S2 noted.  No murmur, rubs or gallops noted. No JVD or BLE edema. No carotid bruits noted. Pulmonary/Chest: Normal effort and positive vesicular breath sounds. No respiratory distress. No wheezes, rales or ronchi noted.         Assessment & Plan:  Paronychia of right thumb:  Does not need to be drained at this point Continue soaking in warm water and try to squeeze pus out Watch for increased redness, swelling and pain Pt will call back if she decides she would like abx- at that time would call in Keflex 500 mg QID x 5 days Flu shot today per pt request  RTC as needed

## 2013-10-15 ENCOUNTER — Other Ambulatory Visit: Payer: Self-pay | Admitting: Internal Medicine

## 2013-10-15 ENCOUNTER — Telehealth: Payer: Self-pay

## 2013-10-15 MED ORDER — CEPHALEXIN 500 MG PO CAPS: 500.0000 mg | ORAL_CAPSULE | Freq: Four times a day (QID) | ORAL | Status: DC

## 2013-10-15 NOTE — Telephone Encounter (Signed)
Pt is aware.  

## 2013-10-15 NOTE — Telephone Encounter (Signed)
Pt seen 09/04/13 and pt thought rt thumb infection cleared; 5 days ago rt thumb got red at cuticle with swelling and throbbing pain, cannot drain anything from thumb and no red streaks from thumb. pt request antibiotic offered at 09/04/13 appt. Pt said 3 weeks ago rt elbow began hurting on and off, no redness or swelling in elbow. Pt wants to know if thinks related.[facility]. Pt request cb.

## 2013-10-15 NOTE — Telephone Encounter (Signed)
The elbow pain should not be related. I will refill her abx

## 2013-11-12 ENCOUNTER — Telehealth: Payer: Self-pay

## 2013-11-12 NOTE — Telephone Encounter (Signed)
Pt left v/m; pt was taking antibiotic, Keflex, and pt stopped antibiotic due to causing diarrhea. Pt request different antibiotic sent to [facility] because infection has come back to rt thumb. Last seen 09/04/13; does pt need appt? Pt request cb.

## 2013-11-12 NOTE — Telephone Encounter (Signed)
Left message on voicemail for pt to return call  

## 2013-11-12 NOTE — Telephone Encounter (Signed)
Yes she will need an appt. She can try warm water soaks tid until she can come in.

## 2013-11-14 NOTE — Telephone Encounter (Signed)
She needs an appt.

## 2013-11-14 NOTE — Telephone Encounter (Signed)
Pt states she is now experiencing diarrhea with the current Ax, she wants to know if something else can be sent in or will she still need to make a f/u appt--please advise

## 2013-11-14 NOTE — Telephone Encounter (Signed)
Left message on voicemail for pt to return call  

## 2013-11-16 ENCOUNTER — Telehealth: Payer: Self-pay | Admitting: Family Medicine

## 2013-11-16 NOTE — Telephone Encounter (Signed)
Pt called back and I explained that she needs to come back in and be re evaluated if she wanted another antibiotic. I told her to continue doing the warm water soaks. She was not happy about having to come back in. She said she would like to see Dr. Dayton MartesAron and I offered her an appointment today at 1:30pm. She could not get off work and stated she would just take the antibiotic and continue with having diarrhea.

## 2013-11-23 ENCOUNTER — Encounter: Payer: Self-pay | Admitting: Internal Medicine

## 2013-11-23 ENCOUNTER — Ambulatory Visit (INDEPENDENT_AMBULATORY_CARE_PROVIDER_SITE_OTHER): Payer: BC Managed Care – PPO | Admitting: Internal Medicine

## 2013-11-23 ENCOUNTER — Ambulatory Visit (INDEPENDENT_AMBULATORY_CARE_PROVIDER_SITE_OTHER)
Admission: RE | Admit: 2013-11-23 | Discharge: 2013-11-23 | Disposition: A | Discharge status: Home / Self Care | Payer: BC Managed Care – PPO | Source: Ambulatory Visit | Attending: Internal Medicine | Admitting: Internal Medicine

## 2013-11-23 VITALS — BP 128/84 | HR 68 | Temp 98.2°F | Resp 16 | Ht 67.0 in | Wt 134.0 lb

## 2013-11-23 DIAGNOSIS — S299XXA Unspecified injury of thorax, initial encounter: Secondary | ICD-10-CM

## 2013-11-23 DIAGNOSIS — L03011 Cellulitis of right finger: Secondary | ICD-10-CM

## 2013-11-23 DIAGNOSIS — M25521 Pain in right elbow: Secondary | ICD-10-CM

## 2013-11-23 DIAGNOSIS — L03019 Cellulitis of unspecified finger: Secondary | ICD-10-CM

## 2013-11-23 DIAGNOSIS — M25529 Pain in unspecified elbow: Secondary | ICD-10-CM

## 2013-11-23 DIAGNOSIS — S298XXA Other specified injuries of thorax, initial encounter: Secondary | ICD-10-CM

## 2013-11-23 MED ORDER — IBUPROFEN 600 MG PO TABS: 600.0000 mg | ORAL_TABLET | Freq: Three times a day (TID) | ORAL | Status: AC | PRN

## 2013-11-23 MED ORDER — AMOXICILLIN-POT CLAVULANATE 875-125 MG PO TABS: 1.0000 | ORAL_TABLET | Freq: Two times a day (BID) | ORAL | Status: DC

## 2013-11-23 NOTE — Patient Instructions (Signed)

## 2013-11-23 NOTE — Progress Notes (Signed)
Pre visit review using our clinic review tool, if applicable. No additional management support is needed unless otherwise documented below in the visit note. 

## 2013-11-25 ENCOUNTER — Encounter: Payer: Self-pay | Admitting: Internal Medicine

## 2013-11-25 NOTE — Progress Notes (Signed)
Subjective:    Patient ID: Kayla Sims, female    DOB: 05/11/1961, 53 y.o.   MRN: [id number]  HPI Comments: #1 she fell 5 days ago and has persistent left lower rib cage pain #2 she has had right elbow pain for several months #3 she has a right thumb infection that will not resolve despite therapy with keflex  Chest Pain  This is a new problem. The current episode started in the past 7 days. The onset quality is sudden. The problem occurs intermittently. The problem has been gradually improving. The pain is present in the lateral region. The pain is at a severity of 2/10. The pain is mild. The quality of the pain is described as sharp. The pain does not radiate. Pertinent negatives include no abdominal pain, back pain, claudication, cough, diaphoresis, dizziness, exertional chest pressure, fever, headaches, hemoptysis, irregular heartbeat, leg pain, lower extremity edema, malaise/fatigue, nausea, near-syncope, numbness, orthopnea, palpitations, PND, shortness of breath, sputum production, syncope, vomiting or weakness. The pain is aggravated by coughing, deep breathing and movement. She has tried NSAIDs for the symptoms. The treatment provided moderate relief. Risk factors: a fall.      Review of Systems  Constitutional: Negative.  Negative for fever, chills, malaise/fatigue, diaphoresis, appetite change and fatigue.  HENT: Negative.   Eyes: Negative.   Respiratory: Negative.  Negative for cough, hemoptysis, sputum production, choking, shortness of breath and wheezing.   Cardiovascular: Positive for chest pain. Negative for palpitations, orthopnea, claudication, leg swelling, syncope, PND and near-syncope.  Gastrointestinal: Negative.  Negative for nausea, vomiting, abdominal pain, diarrhea and constipation.  Endocrine: Negative.   Genitourinary: Negative.   Musculoskeletal: Positive for arthralgias (rt elbow). Negative for back pain, gait problem, joint swelling, myalgias, neck pain  and neck stiffness.  Skin: Negative.   Allergic/Immunologic: Negative.   Neurological: Negative.  Negative for dizziness, weakness, numbness and headaches.  Hematological: Negative.  Negative for adenopathy. Does not bruise/bleed easily.  Psychiatric/Behavioral: Negative.        Objective:   Physical Exam  Vitals reviewed. Constitutional: She is oriented to person, place, and time. She appears well-developed and well-nourished.  Non-toxic appearance. She does not have a sickly appearance. She does not appear ill. No distress.  HENT:  Head: Normocephalic and atraumatic.  Mouth/Throat: Oropharynx is clear and moist.  Eyes: Conjunctivae are normal. Right eye exhibits no discharge. Left eye exhibits no discharge. No scleral icterus.  Neck: Normal range of motion. Neck supple. No JVD present. No tracheal deviation present. No thyromegaly present.  Cardiovascular: Normal rate, regular rhythm, normal heart sounds and intact distal pulses.  Exam reveals no gallop and no friction rub.   No murmur heard. Pulmonary/Chest: Effort normal and breath sounds normal. No stridor. No respiratory distress. She has no wheezes. She has no rales. Chest wall is not dull to percussion. She exhibits tenderness, bony tenderness and deformity. She exhibits no mass, no laceration, no crepitus, no edema, no swelling and no retraction.    Abdominal: Soft. Bowel sounds are normal. She exhibits no distension and no mass. There is no tenderness. There is no rebound and no guarding.  Musculoskeletal: Normal range of motion. She exhibits no edema and no tenderness.       Right elbow: Normal.She exhibits normal range of motion, no swelling, no effusion, no deformity and no laceration. No tenderness found. No radial head, no medial epicondyle, no lateral epicondyle and no olecranon process tenderness noted.       Hands: Lymphadenopathy:  She has no cervical adenopathy.  Neurological: She is oriented to person, place, and  time.  Skin: Skin is warm and dry. No rash noted. She is not diaphoretic. No erythema. No pallor.          Assessment & Plan:

## 2013-11-25 NOTE — Assessment & Plan Note (Signed)
Plain films are normal - this is a contusion Will start motrin for the pain

## 2013-11-25 NOTE — Assessment & Plan Note (Signed)
Will see if augmentin with treat the infection If this does not treat it adequately then will have her see hand surgery for further evaluation

## 2013-11-25 NOTE — Assessment & Plan Note (Signed)
Will check plain films for spurring, djd I think this is tendonitis Will start motrin

## 2013-11-28 ENCOUNTER — Telehealth: Payer: Self-pay | Admitting: *Deleted

## 2013-11-28 DIAGNOSIS — M25521 Pain in right elbow: Secondary | ICD-10-CM

## 2013-11-28 DIAGNOSIS — S299XXA Unspecified injury of thorax, initial encounter: Secondary | ICD-10-CM

## 2013-11-28 MED ORDER — TRAMADOL HCL 50 MG PO TABS: 50.0000 mg | ORAL_TABLET | Freq: Three times a day (TID) | ORAL | Status: DC | PRN

## 2013-11-28 NOTE — Telephone Encounter (Signed)
Patient phoned stating that she had seen the chest xray results on her mychart acct, but no elbow xrays.  Also, patient states she is still in pain with her ribs and wants to know plan of care or treatment or alternate medication.  Please advise.  CB# [phone]

## 2013-11-28 NOTE — Telephone Encounter (Signed)
There is no info about an elbow xray - please inquire with radiology Try tramadol for the pain

## 2013-11-29 NOTE — Telephone Encounter (Signed)
Notified patient of MD response and recommendation.  Because I couldn't IMMEDIATELY give her her chest xray results, she got upset & hung up.

## 2013-11-29 NOTE — Telephone Encounter (Signed)
Contacted radiology, stated they didn't know why it wasn't performed, but to have patient return to have it done if you still wanted it done.  Please advise & I'll call patient.

## 2013-11-29 NOTE — Telephone Encounter (Signed)
Yes, ask her to return to have the xray done

## 2013-11-30 ENCOUNTER — Telehealth: Payer: Self-pay | Admitting: Family Medicine

## 2013-11-30 NOTE — Telephone Encounter (Signed)
An elbow xray was ordered but it was not done She has been asked to return to xray to get the elbow xray done

## 2013-11-30 NOTE — Telephone Encounter (Signed)
Pt saw Dr. Yetta BarreJones last week.  She wants someone to call to explain the x-ray.  She thinks she is to go back to X-Ray for more x-rays.  Please call her.

## 2013-12-03 ENCOUNTER — Telehealth: Payer: Self-pay | Admitting: *Deleted

## 2013-12-03 NOTE — Telephone Encounter (Signed)
Pt lm on vm stating that she had a chest xray on 11/23/13. She states that it was ordered by another provider, but she has not yet been contacted in regards to the results. Pt indicates that she attempted to contact them, but was unsuccessful and was told to contact her PCP. Pt can be reached at [phone] if needed.

## 2013-12-03 NOTE — Telephone Encounter (Signed)
CXR reviewed through epic.  No acute findings.

## 2013-12-03 NOTE — Telephone Encounter (Signed)
Spoke to pt and advised per Dr Aron.  

## 2013-12-03 NOTE — Telephone Encounter (Signed)
Pt is aware to re do the x-ray.

## 2013-12-03 NOTE — Telephone Encounter (Signed)
[phone] requesting same info.  Phoned patient and reread each communication with her in the past week.

## 2013-12-06 ENCOUNTER — Ambulatory Visit (INDEPENDENT_AMBULATORY_CARE_PROVIDER_SITE_OTHER): Payer: BC Managed Care – PPO | Admitting: Family Medicine

## 2013-12-06 ENCOUNTER — Encounter: Payer: Self-pay | Admitting: Family Medicine

## 2013-12-06 VITALS — BP 114/62 | HR 71 | Temp 97.3°F | Ht 66.25 in | Wt 139.0 lb

## 2013-12-06 DIAGNOSIS — M25529 Pain in unspecified elbow: Secondary | ICD-10-CM

## 2013-12-06 DIAGNOSIS — M25521 Pain in right elbow: Secondary | ICD-10-CM

## 2013-12-06 DIAGNOSIS — L03011 Cellulitis of right finger: Secondary | ICD-10-CM

## 2013-12-06 DIAGNOSIS — L03019 Cellulitis of unspecified finger: Secondary | ICD-10-CM

## 2013-12-06 MED ORDER — AMOXICILLIN-POT CLAVULANATE 875-125 MG PO TABS: 1.0000 | ORAL_TABLET | Freq: Two times a day (BID) | ORAL | Status: DC

## 2013-12-06 NOTE — Progress Notes (Signed)
   Subjective:   Patient ID: Kayla Sims, female    DOB: 1961-08-20, 53 y.o.   MRN: [id number]  Kayla Sims is a pleasant 53 y.o. year old female who presents to clinic today with Follow-up  on 12/06/2013  HPI: Right elbow pain- fell two weeks ago- was seen by Dr. Yetta BarreJones.  Note reviewed.  Elbow xray ordered but not done.   CXR was done which was neg. Pain has improved.  Wants to know if she needs to do anything more at this point.  Right thumb paronychia.  Saw Nicki ReaperRegina Baity for this complaint in 08/2013.  Was placed on course of Keflex.  Symptoms improve a little then worsened- saw Dr Yetta BarreJones and was placed on Augment on 11/23/2013.  Symptoms have almost completely resolved.  Two days ago- started pulsating again and getting red.  No fever. No n/v/d.  Patient Active Problem List   Diagnosis Date Noted  . Right elbow pain 11/23/2013  . Chest wall injury 11/23/2013  . Paronychia of right thumb 11/23/2013  . Depression with anxiety 11/23/2011   No past medical history on file. No past surgical history on file. History  Substance Use Topics  . Smoking status: Never Smoker   . Smokeless tobacco: Not on file  . Alcohol Use: No   Family History  Problem Relation Age of Onset  . Heart disease Mother   . Cancer Sister 342    uterine   Allergies  Allergen Reactions  . Sulfa Antibiotics Rash   Current Outpatient Prescriptions on File Prior to Visit  Medication Sig Dispense Refill  . ALPRAZolam (XANAX) 0.25 MG tablet Take 1 tablet (0.25 mg total) by mouth at bedtime as needed.  30 tablet  0  . ibuprofen (ADVIL,MOTRIN) 600 MG tablet Take 1 tablet (600 mg total) by mouth every 8 (eight) hours as needed.  90 tablet  1  . Multiple Vitamin (MULTIVITAMIN) tablet Take 1 tablet by mouth daily.      . traMADol (ULTRAM) 50 MG tablet Take 1 tablet (50 mg total) by mouth every 8 (eight) hours as needed.  50 tablet  1  . [DISCONTINUED] FLUoxetine (PROZAC) 10 MG capsule Take 1 capsule (10 mg  total) by mouth daily.  30 capsule  11   No current facility-administered medications on file prior to visit.   The PMH, PSH, Social History, Family History, Medications, and allergies [facility], and have been updated if relevant.     Review of Systems    See HPI Objective:    BP 114/62  Pulse 71  Temp(Src) 97.3 F (36.3 C) (Oral)  Ht 5' 6.25" (1.683 m)  Wt 139 lb (63.05 kg)  BMI 22.26 kg/m2  SpO2 97%   Physical Exam  Nursing note and vitals reviewed. Constitutional: She appears well-developed and well-nourished. No distress.  HENT:  Head: Normocephalic and atraumatic.  Musculoskeletal: Normal range of motion.       Right elbow: She exhibits normal range of motion, no swelling, no effusion, no deformity and no laceration. No tenderness found. No radial head, no lateral epicondyle and no olecranon process tenderness noted.       Arms:         Assessment & Plan:   Paronychia of right thumb - Plan: amoxicillin-clavulanate (AUGMENTIN) 875-125 MG per tablet  Right elbow pain No Follow-up on file.

## 2013-12-06 NOTE — Progress Notes (Signed)
Pre-visit discussion using our clinic review tool. No additional management support is needed unless otherwise documented below in the visit note.  

## 2013-12-06 NOTE — Assessment & Plan Note (Signed)
Chronic and non fluctuant at this point- nothing to drain. Will add 5 more days of Augmentin. If symptoms deteriorate again, recommended referral to hand surgeon. The patient indicates understanding of these issues and agrees with the plan.

## 2013-12-06 NOTE — Assessment & Plan Note (Signed)
Exam benign- reassurance provided. Advised to come here for elbow xray if symptoms return. The patient indicates understanding of these issues and agrees with the plan.

## 2013-12-06 NOTE — Patient Instructions (Addendum)
Great see you. Take 5 days of Augmentin - 1 tablet twice daily for 5 days.  Call me with an update.  You may need referral to a hand surgeon if this persists.  Paronychia Paronychia is an inflammatory reaction involving the folds of the skin surrounding the fingernail. This is commonly caused by an infection in the skin around a nail. The most common cause of paronychia is frequent wetting of the hands (as seen with bartenders, food servers, nurses or others who wet their hands). This makes the skin around the fingernail susceptible to infection by bacteria (germs) or fungus. Other predisposing factors are:  Aggressive manicuring.  Nail biting.  Thumb sucking. The most common cause is a staphylococcal (a type of germ) infection, or a fungal (Candida) infection. When caused by a germ, it usually comes on suddenly with redness, swelling, pus and is often painful. It may get under the nail and form an abscess (collection of pus), or form an abscess around the nail. If the nail itself is infected with a fungus, the treatment is usually prolonged and may require oral medicine for up to one year. Your caregiver will determine the length of time treatment is required. The paronychia caused by bacteria (germs) may largely be avoided by not pulling on hangnails or picking at cuticles. When the infection occurs at the tips of the finger it is called felon. When the cause of paronychia is from the herpes simplex virus (HSV) it is called herpetic whitlow. TREATMENT  When an abscess is present treatment is often incision and drainage. This means that the abscess must be cut open so the pus can get out. When this is done, the following home care instructions should be followed. HOME CARE INSTRUCTIONS   It is important to keep the affected fingers very dry. Rubber or plastic gloves over cotton gloves should be used whenever the hand must be placed in water.  Keep wound clean, dry and dressed as suggested by  your caregiver between warm soaks or warm compresses.  Soak in warm water for fifteen to twenty minutes three to four times per day for bacterial infections. Fungal infections are very difficult to treat, so often require treatment for long periods of time.  For bacterial (germ) infections take antibiotics (medicine which kill germs) as directed and finish the prescription, even if the problem appears to be solved before the medicine is gone.  Only take over-the-counter or prescription medicines for pain, discomfort, or fever as directed by your caregiver. SEEK IMMEDIATE MEDICAL CARE IF:  You have redness, swelling, or increasing pain in the wound.  You notice pus coming from the wound.  You have a fever.  You notice a bad smell coming from the wound or dressing. Document Released: 03/30/2001 Document Revised: 12/27/2011 Document Reviewed: 11/29/2008 [facility] Patient Information 2014 [place], [place].

## 2014-01-28 ENCOUNTER — Encounter: Payer: Self-pay | Admitting: Family Medicine

## 2014-01-28 ENCOUNTER — Ambulatory Visit (INDEPENDENT_AMBULATORY_CARE_PROVIDER_SITE_OTHER): Payer: BC Managed Care – PPO | Admitting: Family Medicine

## 2014-01-28 VITALS — BP 110/78 | HR 60 | Temp 98.1°F | Wt 139.0 lb

## 2014-01-28 DIAGNOSIS — L03019 Cellulitis of unspecified finger: Secondary | ICD-10-CM

## 2014-01-28 DIAGNOSIS — L03011 Cellulitis of right finger: Secondary | ICD-10-CM

## 2014-01-28 MED ORDER — DOXYCYCLINE HYCLATE 100 MG PO CAPS: 100.0000 mg | ORAL_CAPSULE | Freq: Two times a day (BID) | ORAL | Status: DC

## 2014-01-28 NOTE — Progress Notes (Signed)
Pre visit review using our clinic review tool, if applicable. No additional management support is needed unless otherwise documented below in the visit note. 

## 2014-01-28 NOTE — Patient Instructions (Addendum)
I'm suspicious for repeat bacterial infection of the edge of your thumb.  Treat with doxycycline course for 10 days.  If recurs, there may be fungal infection at the nail and you may benefit from antifungal. Continue warm water soaks three times a day for thumb. Try biotin vitamin for nail growth (over the counter). If any worsening redness/swelling or streaking redness please return to see us.  May use vaseline ointment or antibiotic ointment to cut on skin at tip of thumb (for comfort and to speed healing)

## 2014-01-28 NOTE — Progress Notes (Signed)
   BP 110/78  Pulse 60  Temp(Src) 98.1 F (36.7 C) (Oral)  Wt 139 lb (63.05 kg)   CC: R thumb infection?  Subjective:    Patient ID: Kayla Sims, female    DOB: [birth year], 53 y.o.   MRN: [id number]  HPI: Kayla MaxinDonna M Cargo is a 53 y.o. female presenting on 01/28/2014 for Nail Problem   Check R thumb - treated initially with keflex (08/2013) then augmentin course (11/2013).  sxs fully resolved. Then yesterday noticed swelling at right thumb medial nailfold, yesterday drained some pus.   Does use hand protection when working with chemicals or washing dishes. Tends to get cuts around thumbnails from dry skin. Doesn't bite nails.  No fevers/chills, nausea/abd pain.  Relevant past medical, surgical, family and social history reviewed and updated as indicated.  Allergies and medications reviewed and updated. Current Outpatient Prescriptions on File Prior to Visit  Medication Sig  . ALPRAZolam (XANAX) 0.25 MG tablet Take 1 tablet (0.25 mg total) by mouth at bedtime as needed.  . Multiple Vitamin (MULTIVITAMIN) tablet Take 1 tablet by mouth daily.  Marland Kitchen. ibuprofen (ADVIL,MOTRIN) 600 MG tablet Take 1 tablet (600 mg total) by mouth every 8 (eight) hours as needed.  . traMADol (ULTRAM) 50 MG tablet Take 1 tablet (50 mg total) by mouth every 8 (eight) hours as needed.  . [DISCONTINUED] FLUoxetine (PROZAC) 10 MG capsule Take 1 capsule (10 mg total) by mouth daily.   No current facility-administered medications on file prior to visit.    Review of Systems Per HPI unless specifically indicated above    Objective:    BP 110/78  Pulse 60  Temp(Src) 98.1 F (36.7 C) (Oral)  Wt 139 lb (63.05 kg)  Physical Exam  Nursing note and vitals reviewed. Constitutional: She appears well-developed and well-nourished. No distress.  Musculoskeletal: She exhibits no edema.       Hands: Skin: Skin is warm and dry.  R medial thumb with erythema and swelling at nailfold. Nail is brittle and deformed at  medial edge. Smaller cut distal lateral thumb tip without surrounding erythema.    No results found for this or any previous visit.    Assessment & Plan:   Problem List Items Addressed This Visit   Paronychia of right thumb - Primary     Acute recurrence of prior paronychia.   I&D attempted with 18g needle with ethyl chloride as numbing after area cleaned with betadine and alcohol without significant drainage. Treat infection with oral doxycycline 10d course and discussed home care instructions. For cut in skin - recommended regular vaseline use to area. Doxy photosensitivity precautions discussed, as well as rec take with food to avoid nausea. ?underlying onychomycosis predisposing to paronychia - consider oral antifungal to treat. Rec start biotin for nail health.        Follow up plan: Return if symptoms worsen or fail to improve.

## 2014-01-28 NOTE — Assessment & Plan Note (Addendum)
Acute recurrence of prior paronychia.   I&D attempted with 18g needle with ethyl chloride as numbing after area cleaned with betadine and alcohol without significant drainage. Treat infection with oral doxycycline 10d course and discussed home care instructions. For cut in skin - recommended regular vaseline use to area. Doxy photosensitivity precautions discussed, as well as rec take with food to avoid nausea. ?underlying onychomycosis predisposing to paronychia - consider oral antifungal to treat. Rec start biotin for nail health. Pt agrees with plan.

## 2014-09-14 IMAGING — CR DG CHEST 2V
2 series · 2 of 2 positions shown · non-contrast
Comparison: None.

CLINICAL DATA: Recent traumatic injury with pain

EXAM:
CHEST  2 VIEW

[view not recorded (1 of 2)]
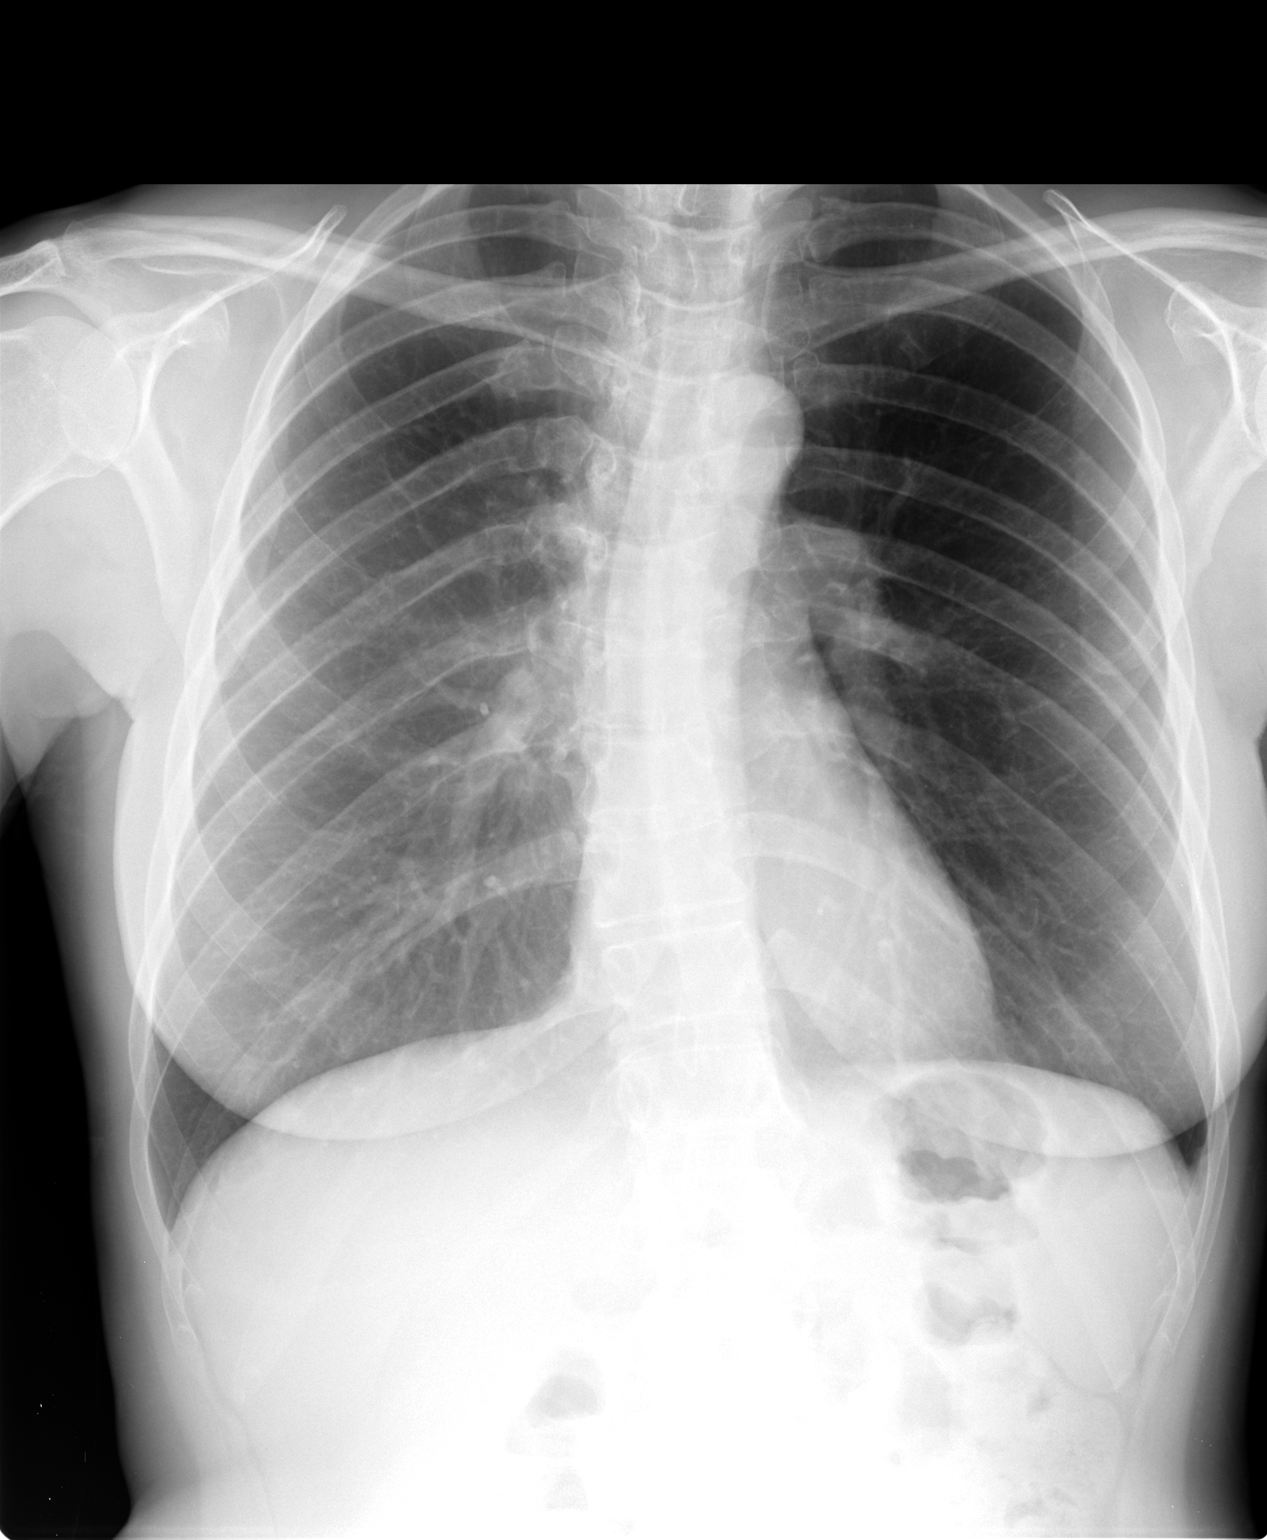

[view not recorded (2 of 2)]
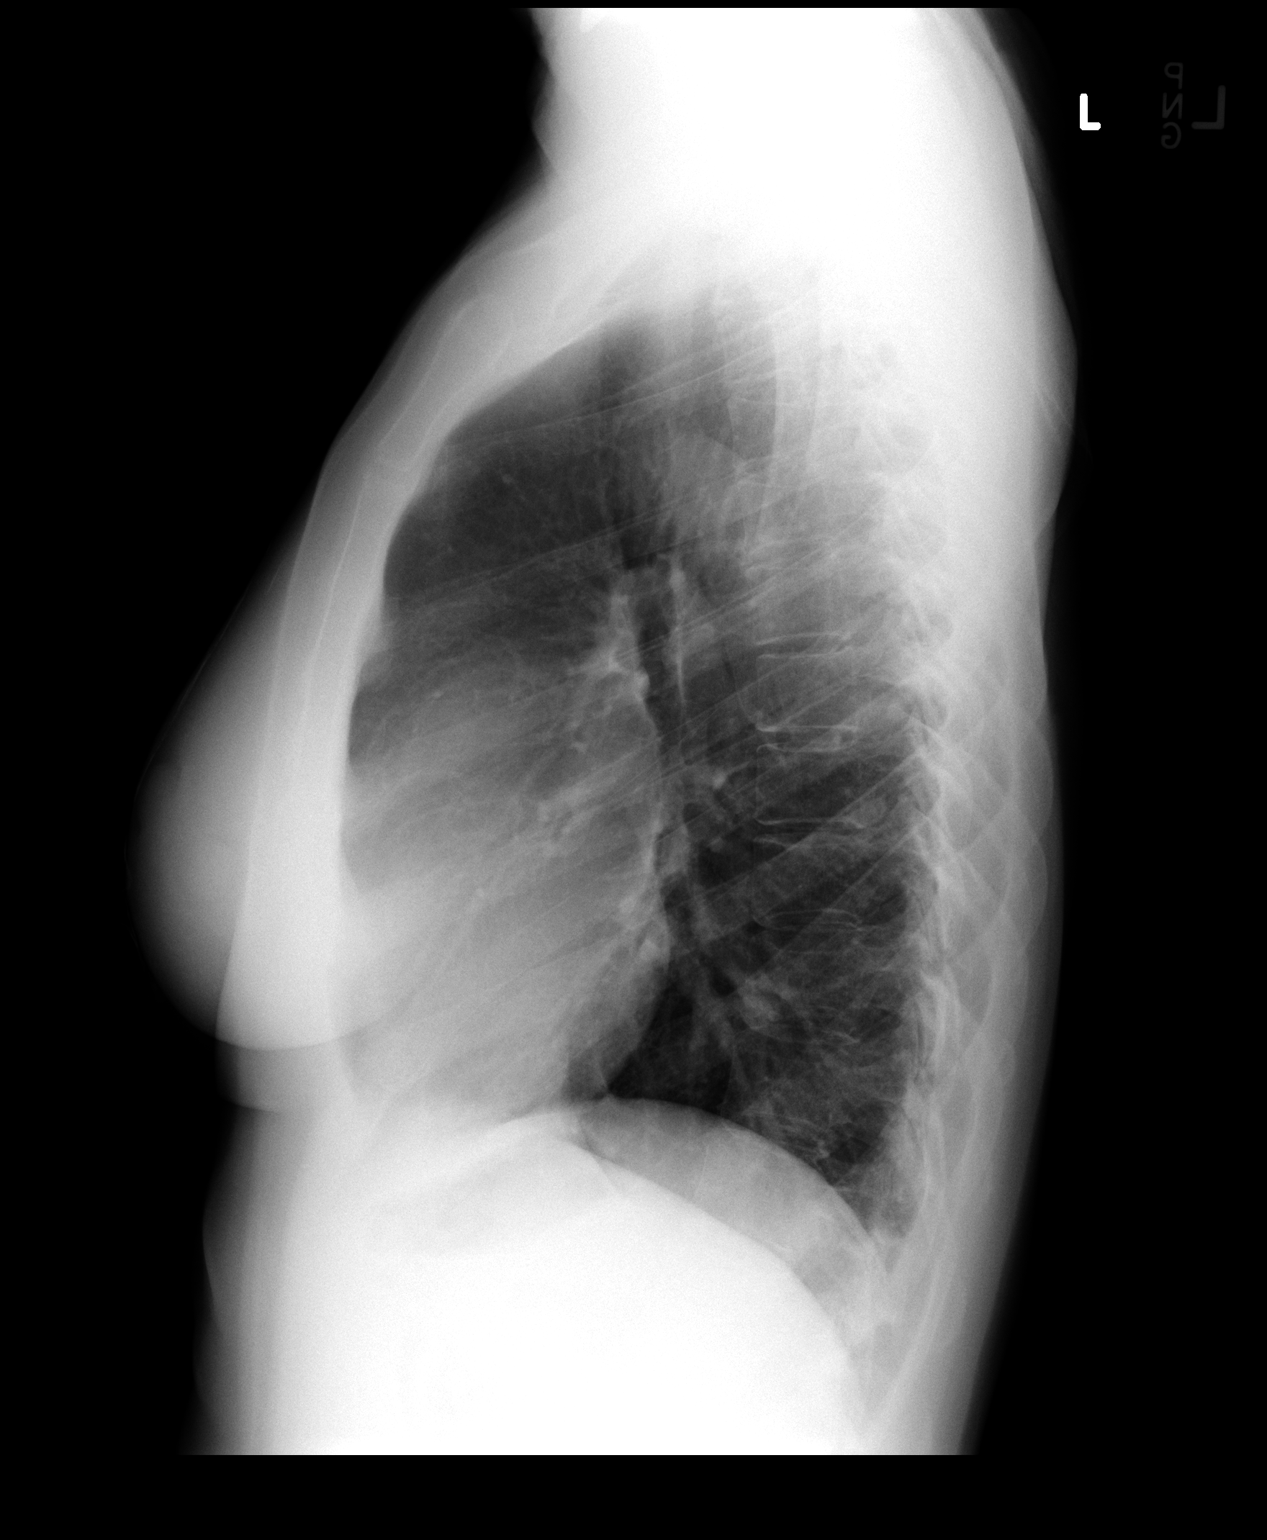

[2 of 2 positions shown; findings below may reference images not displayed]

FINDINGS: The cardiac shadow is within normal limits. A mild scoliosis of the
thoracolumbar spine is seen. A nipple shadow is noted overlying the
left lung base. No acute bony abnormality is seen. No gross soft
tissue abnormality is noted.
IMPRESSION: No acute abnormality seen.

## 2014-12-09 ENCOUNTER — Telehealth: Payer: Self-pay | Admitting: Family Medicine

## 2014-12-09 NOTE — Telephone Encounter (Signed)
Pt called; pt went to urgent care for physical today; pt wants to know if her immunization list shows when had last tb skin test and MMR. Do not see immunization record scanned into system. Pt notified and she will ck with previous physician at [facility].

## 2014-12-09 NOTE — Telephone Encounter (Signed)
Pt called and needs a cpe and tb skin test for her job this week. She is a patient of Dr. Elmer SowAron's and would like to know if you can do her cpe this week? She is not having any other issues at this time. Please advise

## 2014-12-09 NOTE — Telephone Encounter (Signed)
I can do her CPE if I have a physical slot. If not, she can do a nurse visit for a TB test and schedule her CPE later

## 2015-03-18 ENCOUNTER — Telehealth: Payer: Self-pay

## 2015-03-18 NOTE — Telephone Encounter (Signed)
PLEASE NOTE: All timestamps contained within this report are represented as [place] Standard Time. CONFIDENTIALTY NOTICE: This fax transmission is intended only for the addressee. It contains information that is legally privileged, confidential or otherwise protected from use or disclosure. If you are not the intended recipient, you are strictly prohibited from reviewing, disclosing, copying using or disseminating any of this information or taking any action in reliance on or regarding this information. If you have received this fax in error, please notify us immediately by telephone so that we can arrange for its return to [place]. Phone: [phone], Toll-Free: [phone], Fax: [phone] Page: 1 of 2 Call Id: [id number] [place] [facility] Night - Client TELEPHONE ADVICE RECORD [facility] Patient Name: Kayla Sims Gender: Female DOB: 1960/12/27 Age: 54 Y 7 M Return Phone Number: [phone] (Primary) Address: City/State/Zip: [place] [address] Client [place] [facility] Night - Client Client Site [facility] - Night Physician Ruthe Mannan Contact Type Call Call Type Triage / Clinical Relationship To Patient Self Return Phone Number [phone] (Primary) Chief Complaint Sore Throat Initial Comment Caller states sore throat PreDisposition Call Doctor Nurse Assessment Nurse: Izola Price, RN, Cala Bradford Date/Time Lamount Cohen Time): 03/16/2015 12:37:01 PM Confirm and document reason for call. If symptomatic, describe symptoms. ---Caller states sore throat. Both sons have had strep in past 2 weeks. temp now but unable to take it. not eaten today but is drinking. hasn't been feeling well for a week but got really bad last night. Has a little bit of white in the back of the throat but not a lot. still has tonsils. Has the patient traveled out of the country within the last 30 days? ---No Does the patient require triage?  ---Yes Related visit to physician within the last 2 weeks? ---No Does the PT have any chronic conditions? (i.e. diabetes, asthma, etc.) ---No Did the patient indicate they were pregnant? ---No Guidelines Guideline Title Affirmed Question Affirmed Notes Nurse Date/Time Lamount Cohen Time) Sore Throat SEVERE (e.g., excruciating) throat pain Izola Price, RN, Cala Bradford 03/16/2015 12:54:17 PM Disp. Time Lamount Cohen Time) Disposition Final User 03/16/2015 1:10:25 PM Called On-Call Provider Izola Price, RN, [facility] 03/16/2015 1:14:39 PM Clinical Call Izola Price, RN, Cala Bradford 03/16/2015 12:59:05 PM See Physician within 24 Hours Yes Izola Price, RN, Anders Simmonds Understands: Yes Disagree/Comply: Comply PLEASE NOTE: All timestamps contained within this report are represented as [place] Standard Time. CONFIDENTIALTY NOTICE: This fax transmission is intended only for the addressee. It contains information that is legally privileged, confidential or otherwise protected from use or disclosure. If you are not the intended recipient, you are strictly prohibited from reviewing, disclosing, copying using or disseminating any of this information or taking any action in reliance on or regarding this information. If you have received this fax in error, please notify us immediately by telephone so that we can arrange for its return to [place]. Phone: [phone], Toll-Free: [phone], Fax: [phone] Page: 2 of 2 Call Id: [id number] Care Advice Given Per Guideline SEE PHYSICIAN WITHIN 24 HOURS: * IF OFFICE WILL BE OPEN: You need to be examined within the next 24 hours. Call your doctor when the office opens, and make an appointment. SORE THROAT - For relief of sore throat: * Sip warm chicken broth or apple juice. * Suck on hard candy or a throat lozenge (OTC). * Gargle with warm salt water four times a day. To make salt water, put 1/2 teaspoon of salt in 8 oz (240 ml) of warm water. SOFT DIET: * Eat  a soft diet. Cold drinks, popsicles, and  milk shakes are especially good. Avoid citrus fruits. * Drink plenty of liquids so as to avoid dehydration (8-12 eight oz glasses each day). CALL BACK IF: * You become worse. CARE ADVICE given per Sore Throat (Adult) guideline. Mom requests that nurse call MD to see if an antibiotic can be called in since her 2 sons have had strep recently After Care Instructions Given Call Event Type User Date / Time Description Referrals GO TO FACILITY UNDECIDED Paging DoctorName Phone DateTime Result/Outcome Message Type Notes Raechel AcheDuncan, Shaw [phone] 03/16/2015 1:10:25 PM Called On Call Provider - Reached Doctor Paged Raechel AcheDuncan, Shaw 03/16/2015 1:14:18 PM Spoke with On Call - General Message Result Dr. Para Marchuncan states that he would like to for pt to go in to be seen. Pt. was notified and nurse recommended, after pt asked for places to go, that she go to UC. Most are open today until 5 or 6. Pt stated understanding.

## 2015-03-19 NOTE — Telephone Encounter (Signed)
Please call to check on pt. 

## 2015-03-19 NOTE — Telephone Encounter (Signed)
Lm on pts vm requesting a call back 

## 2016-09-27 ENCOUNTER — Telehealth: Payer: Self-pay | Admitting: Family Medicine

## 2016-09-27 NOTE — Telephone Encounter (Signed)
 [facility] - Client TELEPHONE ADVICE RECORD [facility]  Patient Name: Kayla Sims  DOB: Mar 15, 1961    Initial Comment Caller has severe pain right behind her right eye while she was working out.    Nurse Assessment  Nurse: Dorthula RuePatten, RN, Enrique SackKendra Date/Time (Eastern Time): 09/27/2016 10:28:52 AM  Confirm and document reason for call. If symptomatic, describe symptoms. ---Caller states she is having pain behind her right eye on the temple. She rates her pain a 5. The pain started today during an intense workout this morning.  Does the patient have any new or worsening symptoms? ---Yes  Will a triage be completed? ---Yes  Related visit to physician within the last 2 weeks? ---No  Does the PT have any chronic conditions? (i.e. diabetes, asthma, etc.) ---No  Is this a behavioral health or substance abuse call? ---No     Guidelines    Guideline Title Affirmed Question Affirmed Notes  Headache [1] SEVERE headache (e.g., excruciating) AND [2] "worst headache" of life    Final Disposition User   Go to ED Now (or PCP triage) Dorthula RuePatten, RN, Enrique SackKendra    Comments  No appointments left today [facility]. She would like to be called if their is a cancellation today. She would also like someone from office to call her about transferring over [place] one of [place] as she has recently moved.   Referrals  [facility]   Disagree/Comply: Comply

## 2016-09-27 NOTE — Telephone Encounter (Signed)
Please call pt to help her with this. 

## 2016-10-21 NOTE — Telephone Encounter (Signed)
Spoke with patient, she is now seeing

## 2016-10-21 NOTE — Telephone Encounter (Signed)
Spoke with patient, she is seeing [facility] on [address].  Removed Dr. Dayton MartesAron as PCP at pt's request.

## 2017-11-04 ENCOUNTER — Other Ambulatory Visit: Payer: Self-pay | Admitting: Internal Medicine

## 2017-11-04 DIAGNOSIS — Z0000 Encounter for general adult medical examination without abnormal findings: Secondary | ICD-10-CM

## 2017-11-04 DIAGNOSIS — Z1321 Encounter for screening for nutritional disorder: Secondary | ICD-10-CM

## 2017-11-04 DIAGNOSIS — Z13220 Encounter for screening for lipoid disorders: Secondary | ICD-10-CM

## 2017-11-04 DIAGNOSIS — Z1329 Encounter for screening for other suspected endocrine disorder: Secondary | ICD-10-CM

## 2017-12-08 ENCOUNTER — Other Ambulatory Visit: Payer: BLUE CROSS/BLUE SHIELD | Admitting: Internal Medicine

## 2017-12-08 DIAGNOSIS — Z1321 Encounter for screening for nutritional disorder: Secondary | ICD-10-CM

## 2017-12-08 DIAGNOSIS — Z1329 Encounter for screening for other suspected endocrine disorder: Secondary | ICD-10-CM

## 2017-12-08 DIAGNOSIS — Z0000 Encounter for general adult medical examination without abnormal findings: Secondary | ICD-10-CM

## 2017-12-08 DIAGNOSIS — Z13220 Encounter for screening for lipoid disorders: Secondary | ICD-10-CM

## 2017-12-09 LAB — LIPID PANEL
CHOL/HDL RATIO: 2.7 (calc) (ref <5.0)
Cholesterol: 198 mg/dL (ref <200)
HDL: 74 mg/dL (ref >50)
LDL CHOLESTEROL (CALC): 108 mg/dL — AB
NON-HDL CHOLESTEROL (CALC): 124 mg/dL (ref <130)
TRIGLYCERIDES: 75 mg/dL (ref <150)

## 2017-12-09 LAB — COMPLETE METABOLIC PANEL WITH GFR
AG Ratio: 2 (calc) (ref 1.0–2.5)
ALKALINE PHOSPHATASE (APISO): 50 U/L (ref 33–130)
ALT: 11 U/L (ref 6–29)
AST: 14 U/L (ref 10–35)
Albumin: 4.3 g/dL (ref 3.6–5.1)
BILIRUBIN TOTAL: 0.9 mg/dL (ref 0.2–1.2)
BUN: 12 mg/dL (ref 7–25)
CHLORIDE: 103 mmol/L (ref 98–110)
CO2: 28 mmol/L (ref 20–32)
Calcium: 9.4 mg/dL (ref 8.6–10.4)
Creat: 0.8 mg/dL (ref 0.50–1.05)
GFR, Est African American: 96 mL/min/{1.73_m2} (ref >=60)
GFR, Est Non African American: 82 mL/min/{1.73_m2} (ref >=60)
Globulin: 2.1 g/dL (calc) (ref 1.9–3.7)
Glucose, Bld: 86 mg/dL (ref 65–99)
Potassium: 4.4 mmol/L (ref 3.5–5.3)
Sodium: 138 mmol/L (ref 135–146)
Total Protein: 6.4 g/dL (ref 6.1–8.1)

## 2017-12-09 LAB — CBC WITH DIFFERENTIAL/PLATELET
Basophils Absolute: 9 cells/uL (ref 0–200)
Basophils Relative: 0.2 %
EOS PCT: 2.2 %
Eosinophils Absolute: 99 cells/uL (ref 15–500)
HCT: 35.5 % (ref 35.0–45.0)
HEMOGLOBIN: 11.9 g/dL (ref 11.7–15.5)
Lymphs Abs: 1247 cells/uL (ref 850–3900)
MCH: 29.4 pg (ref 27.0–33.0)
MCHC: 33.5 g/dL (ref 32.0–36.0)
MCV: 87.7 fL (ref 80.0–100.0)
MONOS PCT: 8.6 %
MPV: 11 fL (ref 7.5–12.5)
NEUTROS ABS: 2759 {cells}/uL (ref 1500–7800)
Neutrophils Relative %: 61.3 %
Platelets: 216 10*3/uL (ref 140–400)
RBC: 4.05 10*6/uL (ref 3.80–5.10)
RDW: 12.8 % (ref 11.0–15.0)
Total Lymphocyte: 27.7 %
WBC mixed population: 387 cells/uL (ref 200–950)
WBC: 4.5 10*3/uL (ref 3.8–10.8)

## 2017-12-09 LAB — VITAMIN D 25 HYDROXY (VIT D DEFICIENCY, FRACTURES): Vit D, 25-Hydroxy: 19 ng/mL — ABNORMAL LOW (ref 30–100)

## 2017-12-09 LAB — TSH: TSH: 1.38 mIU/L (ref 0.40–4.50)

## 2017-12-12 ENCOUNTER — Ambulatory Visit (INDEPENDENT_AMBULATORY_CARE_PROVIDER_SITE_OTHER): Payer: BLUE CROSS/BLUE SHIELD | Admitting: Internal Medicine

## 2017-12-12 ENCOUNTER — Encounter: Payer: Self-pay | Admitting: Internal Medicine

## 2017-12-12 VITALS — BP 102/78 | HR 75 | Ht 66.0 in | Wt 140.0 lb

## 2017-12-12 DIAGNOSIS — F410 Panic disorder [episodic paroxysmal anxiety] without agoraphobia: Secondary | ICD-10-CM | POA: Diagnosis not present

## 2017-12-12 DIAGNOSIS — Z23 Encounter for immunization: Secondary | ICD-10-CM

## 2017-12-12 DIAGNOSIS — F419 Anxiety disorder, unspecified: Secondary | ICD-10-CM

## 2017-12-12 DIAGNOSIS — Z0000 Encounter for general adult medical examination without abnormal findings: Secondary | ICD-10-CM | POA: Diagnosis not present

## 2017-12-12 DIAGNOSIS — E559 Vitamin D deficiency, unspecified: Secondary | ICD-10-CM

## 2017-12-12 DIAGNOSIS — F900 Attention-deficit hyperactivity disorder, predominantly inattentive type: Secondary | ICD-10-CM | POA: Diagnosis not present

## 2017-12-12 MED ORDER — ERGOCALCIFEROL 1.25 MG (50000 UT) PO CAPS: 50000.0000 [IU] | ORAL_CAPSULE | ORAL | 0 refills | Status: AC

## 2017-12-12 NOTE — Progress Notes (Signed)
Subjective:    Patient ID: Kayla Sims, female    DOB: 1961/06/13, 57 y.o.   MRN: [id number]  HPI First visit for this 57 year old White Female for health maintenance exam.  GYN physician is Dr. Renaldo Fiddler.  Patient has not had a menstrual period in a year or more but recently had sudden onset of menstrual type bleeding for several days.  She has an appointment next month see GYN physician and should definitely discuss that with her.  Explained to patient this was not normal.  Patient says that she has a history of anxiety attacks.  She has Xanax on hand to take should she feel anxious.  Says she thinks she may have attention deficit disorder which is plagued her for many years.  She is not on medication for that and has not been tested for it.  She also thinks she may have a problem with her memory.  She has seen Dr. Emerson Monte in the past and apparently is on lamotrigine for mood stabilization.  Old records indicate she was on Trintellix 2016.  She has no history of serious illnesses accidents or operations.  Sulfa drugs cause a rash.  Social history: She is married.  Husband works for [employer].  She has 3 children in good health.  2 boys ages 47 and 86 and a daughter age 45.  Social alcohol consumption.  Non-smoker.  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  Tries to get exercise an hour a day 3 or 4 days a week.  Family history: Father age 12 in good health.  Mother age 22 in good health takes thyroid medication.  3 brothers ages 7,62, and 47 in good health.  One sister age 53 in good health.  History of cervical cancer in sister.    Review of Systems issues with anxiety and concentration     Objective:   Physical Exam  Constitutional: She is oriented to person, place, and time. She appears well-developed and well-nourished. No distress.  HENT:  Head: Normocephalic and  atraumatic.  Right Ear: External ear normal.  Left Ear: External ear normal.  Eyes: Conjunctivae and EOM are normal. Pupils are equal, round, and reactive to light. Right eye exhibits no discharge. Left eye exhibits no discharge. No scleral icterus.  Neck: Neck supple. No JVD present. No thyromegaly present.  Cardiovascular: Normal rate, regular rhythm and normal heart sounds.  No murmur heard. Pulmonary/Chest: Effort normal and breath sounds normal. No respiratory distress. She has no wheezes. She has no rales. She exhibits no tenderness.  Abdominal: Soft. Bowel sounds are normal. She exhibits no distension and no mass. There is no tenderness. There is no rebound and no guarding.  Genitourinary:  Genitourinary Comments: Deferred to GYN  Musculoskeletal: She exhibits no edema.  Lymphadenopathy:    She has no cervical adenopathy.  Neurological: She is alert and oriented to person, place, and time. She has normal reflexes. No cranial nerve deficit. Coordination normal.  Skin: Skin is warm and dry. No rash noted. She is not diaphoretic.  Psychiatric: She has a normal mood and affect. Her behavior is normal. Judgment and thought content normal.  Able to remember 3 items after 5 minutes  Vitals reviewed.         Assessment & Plan:  History of anxiety and panic disorder  Attention deficit disorder without hyperactivity  Vitamin D deficiency-current level  is 19  LDL cholesterol 108  Plan: Labs reviewed with her extensively and explained to her.  Needs to take Drisdol 50,000 units weekly for 12 weeks and then follow-up here with office visit and  obtain  vitamin D level and further advisement.  She has Xanax on hand for anxiety issues.  Continue lamotrigine for mood stabilization.  Recommend annual mammogram.  Discussion regarding colonoscopy.  Follow-up in 3 months.  Flu vaccine given at her request

## 2017-12-12 NOTE — Patient Instructions (Signed)
Drisdol 50,000 units weekly for 12 weeks and follow-up office visit and vitamin D level.  GYN about abnormal uterine bleeding.  Recommend annual mammogram.  Flu vaccine given.

## 2018-01-10 DIAGNOSIS — Z01419 Encounter for gynecological examination (general) (routine) without abnormal findings: Secondary | ICD-10-CM | POA: Diagnosis not present

## 2018-01-10 DIAGNOSIS — Z1231 Encounter for screening mammogram for malignant neoplasm of breast: Secondary | ICD-10-CM | POA: Diagnosis not present

## 2018-01-10 DIAGNOSIS — Z6822 Body mass index (BMI) 22.0-22.9, adult: Secondary | ICD-10-CM | POA: Diagnosis not present

## 2018-01-17 DIAGNOSIS — F331 Major depressive disorder, recurrent, moderate: Secondary | ICD-10-CM | POA: Diagnosis not present

## 2018-01-17 DIAGNOSIS — F411 Generalized anxiety disorder: Secondary | ICD-10-CM | POA: Diagnosis not present

## 2018-02-01 ENCOUNTER — Telehealth: Payer: Self-pay | Admitting: Internal Medicine

## 2018-02-01 NOTE — Telephone Encounter (Signed)
Must check with GYN

## 2018-02-01 NOTE — Telephone Encounter (Signed)
Left detailed message.   

## 2018-02-01 NOTE — Telephone Encounter (Signed)
Kayla Sims called to say she is having persistent pain in her ovary area and she is wandering should she come here or go to GYN office

## 2018-02-15 ENCOUNTER — Other Ambulatory Visit: Payer: Self-pay | Admitting: Internal Medicine

## 2018-02-15 DIAGNOSIS — Z0000 Encounter for general adult medical examination without abnormal findings: Secondary | ICD-10-CM

## 2018-02-21 DIAGNOSIS — R1032 Left lower quadrant pain: Secondary | ICD-10-CM | POA: Diagnosis not present

## 2018-02-27 ENCOUNTER — Other Ambulatory Visit: Payer: Self-pay | Admitting: Internal Medicine

## 2018-02-28 ENCOUNTER — Other Ambulatory Visit: Payer: BLUE CROSS/BLUE SHIELD | Admitting: Internal Medicine

## 2018-02-28 DIAGNOSIS — E559 Vitamin D deficiency, unspecified: Secondary | ICD-10-CM | POA: Diagnosis not present

## 2018-02-28 DIAGNOSIS — Z0000 Encounter for general adult medical examination without abnormal findings: Secondary | ICD-10-CM

## 2018-03-01 LAB — VITAMIN D 25 HYDROXY (VIT D DEFICIENCY, FRACTURES): Vit D, 25-Hydroxy: 47 ng/mL (ref 30–100)

## 2018-03-02 ENCOUNTER — Ambulatory Visit: Payer: BLUE CROSS/BLUE SHIELD | Admitting: Internal Medicine

## 2018-03-02 VITALS — BP 110/70 | HR 84 | Ht 66.0 in | Wt 142.0 lb

## 2018-03-02 DIAGNOSIS — E559 Vitamin D deficiency, unspecified: Secondary | ICD-10-CM | POA: Diagnosis not present

## 2018-03-02 DIAGNOSIS — Z23 Encounter for immunization: Secondary | ICD-10-CM

## 2018-03-08 ENCOUNTER — Encounter: Payer: Self-pay | Admitting: Internal Medicine

## 2018-03-08 DIAGNOSIS — F331 Major depressive disorder, recurrent, moderate: Secondary | ICD-10-CM | POA: Diagnosis not present

## 2018-03-08 DIAGNOSIS — F411 Generalized anxiety disorder: Secondary | ICD-10-CM | POA: Diagnosis not present

## 2018-03-08 NOTE — Patient Instructions (Signed)
Take 2000 units vitamin D3 daily over-the-counter.  Tdap vaccine given.

## 2018-03-08 NOTE — Progress Notes (Signed)
   Subjective:    Patient ID: Kayla Sims, female    DOB: 23-May-1961, 57 y.o.   MRN: [id number]  HPI Here for follow-up of vitamin D deficiency.  He was seen for the first time in late February and was found to have vitamin D level of 19.  She was prescribed 50,000 units vitamin D3 weekly for 12 weeks.  Level is now normal at 47.  Recommend vitamin D3 2000 units daily.       Review of Systems see above-no new issues     Objective:   Physical Exam  This was not examined.  10-minute discussion regarding vitamin D deficiency.  Tdap vaccine given    Assessment & Plan:  Vitamin D deficiency-now normal after high-dose vitamin D weekly for 12 weeks.  Take 2000 units vitamin D3 over-the-counter daily.  Need for Tdap vaccine-Tdap given.  Return in 1 year or as needed.

## 2018-04-12 DIAGNOSIS — D2262 Melanocytic nevi of left upper limb, including shoulder: Secondary | ICD-10-CM | POA: Diagnosis not present

## 2018-04-12 DIAGNOSIS — L821 Other seborrheic keratosis: Secondary | ICD-10-CM | POA: Diagnosis not present

## 2018-04-12 DIAGNOSIS — L819 Disorder of pigmentation, unspecified: Secondary | ICD-10-CM | POA: Diagnosis not present

## 2018-04-12 DIAGNOSIS — D2239 Melanocytic nevi of other parts of face: Secondary | ICD-10-CM | POA: Diagnosis not present

## 2018-05-09 DIAGNOSIS — M25511 Pain in right shoulder: Secondary | ICD-10-CM | POA: Diagnosis not present

## 2018-05-09 DIAGNOSIS — M7918 Myalgia, other site: Secondary | ICD-10-CM | POA: Diagnosis not present

## 2018-05-18 DIAGNOSIS — M25511 Pain in right shoulder: Secondary | ICD-10-CM | POA: Diagnosis not present

## 2018-05-18 DIAGNOSIS — M7918 Myalgia, other site: Secondary | ICD-10-CM | POA: Diagnosis not present

## 2018-05-25 DIAGNOSIS — M25511 Pain in right shoulder: Secondary | ICD-10-CM | POA: Diagnosis not present

## 2018-05-25 DIAGNOSIS — M7918 Myalgia, other site: Secondary | ICD-10-CM | POA: Diagnosis not present

## 2018-05-31 DIAGNOSIS — M25511 Pain in right shoulder: Secondary | ICD-10-CM | POA: Diagnosis not present

## 2018-05-31 DIAGNOSIS — M7918 Myalgia, other site: Secondary | ICD-10-CM | POA: Diagnosis not present

## 2018-06-22 DIAGNOSIS — M7918 Myalgia, other site: Secondary | ICD-10-CM | POA: Diagnosis not present

## 2018-06-22 DIAGNOSIS — M25511 Pain in right shoulder: Secondary | ICD-10-CM | POA: Diagnosis not present

## 2018-07-06 DIAGNOSIS — M7918 Myalgia, other site: Secondary | ICD-10-CM | POA: Diagnosis not present

## 2018-07-06 DIAGNOSIS — M25511 Pain in right shoulder: Secondary | ICD-10-CM | POA: Diagnosis not present

## 2018-07-27 DIAGNOSIS — M25511 Pain in right shoulder: Secondary | ICD-10-CM | POA: Diagnosis not present

## 2018-07-27 DIAGNOSIS — M7918 Myalgia, other site: Secondary | ICD-10-CM | POA: Diagnosis not present

## 2018-08-24 DIAGNOSIS — M7918 Myalgia, other site: Secondary | ICD-10-CM | POA: Diagnosis not present

## 2018-08-24 DIAGNOSIS — M25511 Pain in right shoulder: Secondary | ICD-10-CM | POA: Diagnosis not present

## 2018-09-26 DIAGNOSIS — F411 Generalized anxiety disorder: Secondary | ICD-10-CM | POA: Diagnosis not present

## 2018-09-26 DIAGNOSIS — F331 Major depressive disorder, recurrent, moderate: Secondary | ICD-10-CM | POA: Diagnosis not present

## 2018-10-05 DIAGNOSIS — M25511 Pain in right shoulder: Secondary | ICD-10-CM | POA: Diagnosis not present

## 2018-10-05 DIAGNOSIS — M7918 Myalgia, other site: Secondary | ICD-10-CM | POA: Diagnosis not present

## 2018-10-16 ENCOUNTER — Telehealth: Payer: Self-pay | Admitting: Internal Medicine

## 2018-10-16 ENCOUNTER — Encounter: Payer: Self-pay | Admitting: Internal Medicine

## 2018-10-16 ENCOUNTER — Ambulatory Visit: Payer: BLUE CROSS/BLUE SHIELD | Admitting: Internal Medicine

## 2018-10-16 VITALS — BP 130/80 | HR 92 | Temp 99.4°F | Ht 66.0 in | Wt 140.0 lb

## 2018-10-16 DIAGNOSIS — R110 Nausea: Secondary | ICD-10-CM | POA: Diagnosis not present

## 2018-10-16 DIAGNOSIS — R6883 Chills (without fever): Secondary | ICD-10-CM

## 2018-10-16 DIAGNOSIS — B349 Viral infection, unspecified: Secondary | ICD-10-CM

## 2018-10-16 DIAGNOSIS — R509 Fever, unspecified: Secondary | ICD-10-CM

## 2018-10-16 DIAGNOSIS — R52 Pain, unspecified: Secondary | ICD-10-CM

## 2018-10-16 LAB — POCT INFLUENZA A/B
INFLUENZA A, POC: NEGATIVE
Influenza B, POC: NEGATIVE

## 2018-10-16 MED ORDER — OSELTAMIVIR PHOSPHATE 75 MG PO CAPS: 75.0000 mg | ORAL_CAPSULE | Freq: Two times a day (BID) | ORAL | 0 refills | Status: DC

## 2018-10-16 NOTE — Telephone Encounter (Signed)
See today

## 2018-10-16 NOTE — Telephone Encounter (Signed)
Patient c/o nausea, body aches, doesn't know if she has a fever, has had some chills that come and go.  Woke up this morning with these symptoms.  Was around her nephews who were sick over the holidays.  Doesn't think they had the flu, but they were sick.  She has never had the flu before.  No cough/congestion.  Can she be worked in today?    Phone:  [phone]  Thank you.

## 2018-10-16 NOTE — Telephone Encounter (Signed)
Spoke with patient and advised to be here at 4pm to be worked in today.  Patient confirmed.

## 2018-10-16 NOTE — Patient Instructions (Addendum)
Tamiflu 75 mg twice daily for 5 days.  Rest and drink plenty of fluids.  If back pain from fall does not improve she will be seen again next week.

## 2018-10-16 NOTE — Progress Notes (Signed)
   Subjective:    Patient ID: Kayla Sims, female    DOB: Nov 05, 1960, 57 y.o.   MRN: [id number]  HPI 57 year old Female onset today of abdominal cramping.  Does have myalgias and complaint of mild sore throat.  May have been exposed to flu.  Malaise and fatigue.  Some respiratory congestion.    Review of Systems see above.  Apparently she fell on a ski slope recently and has some low back pain.     Objective:   Physical Exam Pharynx very slightly injected.  TMs are clear.  Neck is supple.  Chest clear to auscultation without rales or wheezing.  Rapid flu screen is negative.  Temperature 99.4 degrees.  She looks fatigued.       Assessment & Plan:  Probable acute viral syndrome  Low back pain secondary to fall on ski slope  Plan: Rapid flu test is negative.  However it is possible she does have the flu.  I am placing her on Tamiflu 75 mg twice daily for 5 days.  Tylenol or NSAID need for myalgias and fever.  Rest and drink plenty of fluids.

## 2018-11-22 DIAGNOSIS — M79671 Pain in right foot: Secondary | ICD-10-CM | POA: Diagnosis not present

## 2018-11-22 DIAGNOSIS — M79672 Pain in left foot: Secondary | ICD-10-CM | POA: Diagnosis not present

## 2018-11-22 DIAGNOSIS — M25511 Pain in right shoulder: Secondary | ICD-10-CM | POA: Diagnosis not present

## 2018-11-22 DIAGNOSIS — M25551 Pain in right hip: Secondary | ICD-10-CM | POA: Diagnosis not present

## 2018-12-01 DIAGNOSIS — M79672 Pain in left foot: Secondary | ICD-10-CM | POA: Diagnosis not present

## 2018-12-01 DIAGNOSIS — M25551 Pain in right hip: Secondary | ICD-10-CM | POA: Diagnosis not present

## 2018-12-01 DIAGNOSIS — M79671 Pain in right foot: Secondary | ICD-10-CM | POA: Diagnosis not present

## 2018-12-01 DIAGNOSIS — M25511 Pain in right shoulder: Secondary | ICD-10-CM | POA: Diagnosis not present

## 2018-12-06 DIAGNOSIS — M25551 Pain in right hip: Secondary | ICD-10-CM | POA: Diagnosis not present

## 2018-12-06 DIAGNOSIS — M79672 Pain in left foot: Secondary | ICD-10-CM | POA: Diagnosis not present

## 2018-12-06 DIAGNOSIS — M79671 Pain in right foot: Secondary | ICD-10-CM | POA: Diagnosis not present

## 2018-12-06 DIAGNOSIS — M25511 Pain in right shoulder: Secondary | ICD-10-CM | POA: Diagnosis not present

## 2018-12-15 DIAGNOSIS — M79672 Pain in left foot: Secondary | ICD-10-CM | POA: Diagnosis not present

## 2018-12-15 DIAGNOSIS — M25511 Pain in right shoulder: Secondary | ICD-10-CM | POA: Diagnosis not present

## 2018-12-15 DIAGNOSIS — M79671 Pain in right foot: Secondary | ICD-10-CM | POA: Diagnosis not present

## 2018-12-15 DIAGNOSIS — M25551 Pain in right hip: Secondary | ICD-10-CM | POA: Diagnosis not present

## 2018-12-21 DIAGNOSIS — M79671 Pain in right foot: Secondary | ICD-10-CM | POA: Diagnosis not present

## 2018-12-21 DIAGNOSIS — M79672 Pain in left foot: Secondary | ICD-10-CM | POA: Diagnosis not present

## 2018-12-21 DIAGNOSIS — M25511 Pain in right shoulder: Secondary | ICD-10-CM | POA: Diagnosis not present

## 2018-12-21 DIAGNOSIS — M25551 Pain in right hip: Secondary | ICD-10-CM | POA: Diagnosis not present

## 2018-12-27 DIAGNOSIS — M79671 Pain in right foot: Secondary | ICD-10-CM | POA: Diagnosis not present

## 2018-12-27 DIAGNOSIS — M79672 Pain in left foot: Secondary | ICD-10-CM | POA: Diagnosis not present

## 2018-12-27 DIAGNOSIS — M25511 Pain in right shoulder: Secondary | ICD-10-CM | POA: Diagnosis not present

## 2018-12-27 DIAGNOSIS — M25551 Pain in right hip: Secondary | ICD-10-CM | POA: Diagnosis not present

## 2019-01-03 DIAGNOSIS — M25511 Pain in right shoulder: Secondary | ICD-10-CM | POA: Diagnosis not present

## 2019-01-03 DIAGNOSIS — M25551 Pain in right hip: Secondary | ICD-10-CM | POA: Diagnosis not present

## 2019-01-03 DIAGNOSIS — M79672 Pain in left foot: Secondary | ICD-10-CM | POA: Diagnosis not present

## 2019-01-03 DIAGNOSIS — M79671 Pain in right foot: Secondary | ICD-10-CM | POA: Diagnosis not present

## 2019-04-10 ENCOUNTER — Telehealth: Payer: BC Managed Care – PPO | Admitting: Physician Assistant

## 2019-04-10 DIAGNOSIS — Z8709 Personal history of other diseases of the respiratory system: Secondary | ICD-10-CM

## 2019-04-10 NOTE — Progress Notes (Signed)
Hi Kayla Sims.  I am glad you are feeling better.  I can not order the antibody test for you.  A test is available through [facility] but you will have to coordinate testing with your primary care provider.  You will not be charged for this evisit. No need to respond to this thread unless further action is required on my part.   ===View-only below this line===   ----- Message -----    From: Kayla Sims    Sent: 04/10/2019 12:45 PM EDT      To: E-Visit Mailing List Subject: CoronaVirus (COVID-19) Screening  CoronaVirus (COVID-19) Screening --------------------------------  Question: Do you have any of the following?  Answer:   Cough  Question: Do you have any of the following additional symptoms?  Answer:   Body aches  Question: Have you had a fever? Answer:   Yes  Question: If you are running a fever, please type in your temperature reading Answer:   No longer with fever and no longer coughing. Would like to take the antibodies test  Question: How long have you had the fever? Answer:   For a week  Question: Have others in your home or workplace had similar symptoms? Answer:   Yes  Question: When did your symptoms start? Answer:   6 weeks ago  Question: Have you recently visited any of the following countries? Answer:   None of these  Question: If you have traveled anywhere in the last  2 months please document where you have visited: Answer:     Question: Have you recently been around others from these countries or visited these countries who have had coughing or fever? Answer:   No  Question: Have you recently been around anyone who has been diagnosed with Corona virus? Answer:   No  Question: Have you been taking any medications? Answer:   Yes  Question: If taking medications for these symptoms, please list the names and whether they are helping or not Answer:     Question: Are you treated for any of the following conditions: Asthma, COPD, Diabetes, Renal  Failure (on Dialysis), AIDS, any Neuromuscular disease that effects the clearing of secretions, Heart Failure, or Heart Disease? Answer:   No  Question: Please enter a phone number where you can be reached if we have additional questions about your symptoms Answer:   [phone]  Question: Please list your medication allergies that you may have ? (If 'none' , please list as 'none') Answer:   Lamotrigine  Question: Please list any additional comments  Answer:   I would like an antibodies test to determine if I have already had the virus. My husband works in [place] and travelled there regularly until air travel was halted. He and I both had similar symptoms weeks ago. My daughter has recently been with a friend who's roommate tested positive for COVID-19. I would like to determine my chance of now catching the virus  A total of 5-10 minutes was spent evaluating this patients questionnaire and formulating a plan of care.

## 2019-04-24 DIAGNOSIS — F331 Major depressive disorder, recurrent, moderate: Secondary | ICD-10-CM | POA: Diagnosis not present

## 2019-04-24 DIAGNOSIS — F411 Generalized anxiety disorder: Secondary | ICD-10-CM | POA: Diagnosis not present

## 2019-09-27 DIAGNOSIS — L720 Epidermal cyst: Secondary | ICD-10-CM | POA: Diagnosis not present

## 2019-09-27 DIAGNOSIS — S40861A Insect bite (nonvenomous) of right upper arm, initial encounter: Secondary | ICD-10-CM | POA: Diagnosis not present

## 2019-11-25 ENCOUNTER — Other Ambulatory Visit: Payer: Self-pay | Admitting: Adult Health

## 2019-11-26 ENCOUNTER — Other Ambulatory Visit: Payer: Self-pay

## 2019-11-26 ENCOUNTER — Ambulatory Visit: Payer: BC Managed Care – PPO | Admitting: Internal Medicine

## 2019-11-26 ENCOUNTER — Telehealth: Payer: Self-pay

## 2019-11-26 ENCOUNTER — Encounter: Payer: Self-pay | Admitting: Internal Medicine

## 2019-11-26 VITALS — BP 100/70 | HR 76 | Temp 98.0°F | Ht 66.0 in | Wt 138.0 lb

## 2019-11-26 DIAGNOSIS — J340 Abscess, furuncle and carbuncle of nose: Secondary | ICD-10-CM

## 2019-11-26 MED ORDER — MUPIROCIN 2 % EX OINT: 1.0000 "application " | TOPICAL_OINTMENT | Freq: Two times a day (BID) | CUTANEOUS | 0 refills | Status: AC

## 2019-11-26 MED ORDER — DOXYCYCLINE HYCLATE 100 MG PO TABS: 100.0000 mg | ORAL_TABLET | Freq: Two times a day (BID) | ORAL | 0 refills | Status: DC

## 2019-11-26 NOTE — Telephone Encounter (Signed)
Patient called states she has a sore inside her nose and it is painful she would like to come in to be seen her temperature is normal. Negative for COVID. Okay to schedule to come in?

## 2019-11-26 NOTE — Telephone Encounter (Signed)
OK 

## 2019-11-26 NOTE — Telephone Encounter (Signed)
scheduled

## 2019-11-26 NOTE — Progress Notes (Signed)
   Subjective:    Patient ID: Kayla Sims, female    DOB: Dec 07, 1960, 59 y.o.   MRN: [id number]  HPI 59 year old  Female with soreness right nostril for several days. No discolored nasal drainage. Feels there may be a sore in the medial aspect of right nostril and some swelling above that as well. No fever or chills. No known Covid-19 exposure. No discolored nasal drainage.    Review of Systems     Objective:   Physical Exam  Has abraded are inside medial aspect of right nostril. Outside of nose is not red but tender to touch.      Assessment & Plan:  Carbuncle right nostril   Plan: Bactroban ointment apply to right nostril twice daily for 5-7 days   Doxycycline 100 mg twoce daily x 7 days.

## 2019-11-26 NOTE — Patient Instructions (Signed)
It was a pleasure to see you today. Use Bactroban ointment in right nostril twice daily for 5-7 days. Doxycycline 100 mg twice daily x 7 days.

## 2020-01-17 HISTORY — PX: COLONOSCOPY: SHX174

## 2020-01-21 DIAGNOSIS — Z1159 Encounter for screening for other viral diseases: Secondary | ICD-10-CM | POA: Diagnosis not present

## 2020-01-24 DIAGNOSIS — Q438 Other specified congenital malformations of intestine: Secondary | ICD-10-CM | POA: Diagnosis not present

## 2020-01-24 DIAGNOSIS — Z1211 Encounter for screening for malignant neoplasm of colon: Secondary | ICD-10-CM | POA: Diagnosis not present

## 2020-01-24 DIAGNOSIS — K635 Polyp of colon: Secondary | ICD-10-CM | POA: Diagnosis not present

## 2020-01-24 LAB — HM COLONOSCOPY

## 2020-02-05 ENCOUNTER — Encounter: Payer: Self-pay | Admitting: Internal Medicine

## 2020-02-06 ENCOUNTER — Other Ambulatory Visit: Payer: Self-pay

## 2020-03-11 ENCOUNTER — Other Ambulatory Visit: Payer: Self-pay | Admitting: Adult Health

## 2020-03-19 DIAGNOSIS — M79671 Pain in right foot: Secondary | ICD-10-CM | POA: Diagnosis not present

## 2020-03-19 DIAGNOSIS — M545 Low back pain: Secondary | ICD-10-CM | POA: Diagnosis not present

## 2020-03-19 DIAGNOSIS — M79672 Pain in left foot: Secondary | ICD-10-CM | POA: Diagnosis not present

## 2020-03-19 DIAGNOSIS — M546 Pain in thoracic spine: Secondary | ICD-10-CM | POA: Diagnosis not present

## 2020-03-20 DIAGNOSIS — M79671 Pain in right foot: Secondary | ICD-10-CM | POA: Diagnosis not present

## 2020-03-20 DIAGNOSIS — M79672 Pain in left foot: Secondary | ICD-10-CM | POA: Diagnosis not present

## 2020-03-20 DIAGNOSIS — M546 Pain in thoracic spine: Secondary | ICD-10-CM | POA: Diagnosis not present

## 2020-03-20 DIAGNOSIS — M545 Low back pain: Secondary | ICD-10-CM | POA: Diagnosis not present

## 2020-03-31 DIAGNOSIS — M79672 Pain in left foot: Secondary | ICD-10-CM | POA: Diagnosis not present

## 2020-03-31 DIAGNOSIS — M545 Low back pain: Secondary | ICD-10-CM | POA: Diagnosis not present

## 2020-03-31 DIAGNOSIS — M546 Pain in thoracic spine: Secondary | ICD-10-CM | POA: Diagnosis not present

## 2020-03-31 DIAGNOSIS — M79671 Pain in right foot: Secondary | ICD-10-CM | POA: Diagnosis not present

## 2020-04-03 DIAGNOSIS — M546 Pain in thoracic spine: Secondary | ICD-10-CM | POA: Diagnosis not present

## 2020-04-03 DIAGNOSIS — M545 Low back pain: Secondary | ICD-10-CM | POA: Diagnosis not present

## 2020-04-03 DIAGNOSIS — M79672 Pain in left foot: Secondary | ICD-10-CM | POA: Diagnosis not present

## 2020-04-03 DIAGNOSIS — M79671 Pain in right foot: Secondary | ICD-10-CM | POA: Diagnosis not present

## 2020-04-07 DIAGNOSIS — M79672 Pain in left foot: Secondary | ICD-10-CM | POA: Diagnosis not present

## 2020-04-07 DIAGNOSIS — Z6821 Body mass index (BMI) 21.0-21.9, adult: Secondary | ICD-10-CM | POA: Diagnosis not present

## 2020-04-07 DIAGNOSIS — Z1231 Encounter for screening mammogram for malignant neoplasm of breast: Secondary | ICD-10-CM | POA: Diagnosis not present

## 2020-04-07 DIAGNOSIS — M79671 Pain in right foot: Secondary | ICD-10-CM | POA: Diagnosis not present

## 2020-04-07 DIAGNOSIS — M546 Pain in thoracic spine: Secondary | ICD-10-CM | POA: Diagnosis not present

## 2020-04-07 DIAGNOSIS — Z01419 Encounter for gynecological examination (general) (routine) without abnormal findings: Secondary | ICD-10-CM | POA: Diagnosis not present

## 2020-04-07 DIAGNOSIS — M545 Low back pain: Secondary | ICD-10-CM | POA: Diagnosis not present

## 2020-04-17 DIAGNOSIS — M546 Pain in thoracic spine: Secondary | ICD-10-CM | POA: Diagnosis not present

## 2020-04-17 DIAGNOSIS — M79672 Pain in left foot: Secondary | ICD-10-CM | POA: Diagnosis not present

## 2020-04-17 DIAGNOSIS — M545 Low back pain: Secondary | ICD-10-CM | POA: Diagnosis not present

## 2020-04-17 DIAGNOSIS — M79671 Pain in right foot: Secondary | ICD-10-CM | POA: Diagnosis not present

## 2020-04-28 ENCOUNTER — Telehealth: Payer: Self-pay | Admitting: Internal Medicine

## 2020-04-28 NOTE — Telephone Encounter (Signed)
Kayla Sims [phone]  [facility] called to say she needs a prescription for Therapeutic massage for Insurance purposes. She would like [place] to mail that to her.

## 2020-04-28 NOTE — Telephone Encounter (Addendum)
LVM to CB and schedule and appointment to discuss what she needs the Therapeutic massage for. Last RX was wrote 03/02/2018 at CPE visit

## 2020-05-08 DIAGNOSIS — Z13820 Encounter for screening for osteoporosis: Secondary | ICD-10-CM | POA: Diagnosis not present

## 2020-06-03 DIAGNOSIS — L821 Other seborrheic keratosis: Secondary | ICD-10-CM | POA: Diagnosis not present

## 2020-06-03 DIAGNOSIS — D225 Melanocytic nevi of trunk: Secondary | ICD-10-CM | POA: Diagnosis not present

## 2020-06-03 DIAGNOSIS — D1801 Hemangioma of skin and subcutaneous tissue: Secondary | ICD-10-CM | POA: Diagnosis not present

## 2020-06-03 DIAGNOSIS — D2271 Melanocytic nevi of right lower limb, including hip: Secondary | ICD-10-CM | POA: Diagnosis not present

## 2020-06-03 DIAGNOSIS — L819 Disorder of pigmentation, unspecified: Secondary | ICD-10-CM | POA: Diagnosis not present

## 2020-06-09 ENCOUNTER — Other Ambulatory Visit: Payer: Self-pay | Admitting: Adult Health

## 2020-06-09 DIAGNOSIS — M79672 Pain in left foot: Secondary | ICD-10-CM | POA: Diagnosis not present

## 2020-06-09 DIAGNOSIS — M546 Pain in thoracic spine: Secondary | ICD-10-CM | POA: Diagnosis not present

## 2020-06-09 DIAGNOSIS — M79671 Pain in right foot: Secondary | ICD-10-CM | POA: Diagnosis not present

## 2020-06-09 DIAGNOSIS — M545 Low back pain: Secondary | ICD-10-CM | POA: Diagnosis not present

## 2020-06-18 DIAGNOSIS — M79672 Pain in left foot: Secondary | ICD-10-CM | POA: Diagnosis not present

## 2020-06-18 DIAGNOSIS — L570 Actinic keratosis: Secondary | ICD-10-CM | POA: Diagnosis not present

## 2020-06-18 DIAGNOSIS — M545 Low back pain: Secondary | ICD-10-CM | POA: Diagnosis not present

## 2020-06-18 DIAGNOSIS — M79671 Pain in right foot: Secondary | ICD-10-CM | POA: Diagnosis not present

## 2020-06-18 DIAGNOSIS — M546 Pain in thoracic spine: Secondary | ICD-10-CM | POA: Diagnosis not present

## 2020-06-25 DIAGNOSIS — M79671 Pain in right foot: Secondary | ICD-10-CM | POA: Diagnosis not present

## 2020-06-25 DIAGNOSIS — M79672 Pain in left foot: Secondary | ICD-10-CM | POA: Diagnosis not present

## 2020-06-25 DIAGNOSIS — M545 Low back pain: Secondary | ICD-10-CM | POA: Diagnosis not present

## 2020-06-25 DIAGNOSIS — M546 Pain in thoracic spine: Secondary | ICD-10-CM | POA: Diagnosis not present

## 2020-06-30 DIAGNOSIS — M21962 Unspecified acquired deformity of left lower leg: Secondary | ICD-10-CM | POA: Diagnosis not present

## 2020-06-30 DIAGNOSIS — M21961 Unspecified acquired deformity of right lower leg: Secondary | ICD-10-CM | POA: Diagnosis not present

## 2020-06-30 DIAGNOSIS — M21611 Bunion of right foot: Secondary | ICD-10-CM | POA: Diagnosis not present

## 2020-06-30 DIAGNOSIS — M21612 Bunion of left foot: Secondary | ICD-10-CM | POA: Diagnosis not present

## 2020-07-02 DIAGNOSIS — M79672 Pain in left foot: Secondary | ICD-10-CM | POA: Diagnosis not present

## 2020-07-02 DIAGNOSIS — M79671 Pain in right foot: Secondary | ICD-10-CM | POA: Diagnosis not present

## 2020-07-02 DIAGNOSIS — M546 Pain in thoracic spine: Secondary | ICD-10-CM | POA: Diagnosis not present

## 2020-07-02 DIAGNOSIS — M545 Low back pain: Secondary | ICD-10-CM | POA: Diagnosis not present

## 2020-07-07 DIAGNOSIS — M545 Low back pain: Secondary | ICD-10-CM | POA: Diagnosis not present

## 2020-07-07 DIAGNOSIS — M79671 Pain in right foot: Secondary | ICD-10-CM | POA: Diagnosis not present

## 2020-07-07 DIAGNOSIS — M79672 Pain in left foot: Secondary | ICD-10-CM | POA: Diagnosis not present

## 2020-07-07 DIAGNOSIS — M546 Pain in thoracic spine: Secondary | ICD-10-CM | POA: Diagnosis not present

## 2020-07-14 DIAGNOSIS — M79672 Pain in left foot: Secondary | ICD-10-CM | POA: Diagnosis not present

## 2020-07-14 DIAGNOSIS — M545 Low back pain: Secondary | ICD-10-CM | POA: Diagnosis not present

## 2020-07-14 DIAGNOSIS — M546 Pain in thoracic spine: Secondary | ICD-10-CM | POA: Diagnosis not present

## 2020-07-14 DIAGNOSIS — M79671 Pain in right foot: Secondary | ICD-10-CM | POA: Diagnosis not present

## 2020-07-15 DIAGNOSIS — M21961 Unspecified acquired deformity of right lower leg: Secondary | ICD-10-CM | POA: Diagnosis not present

## 2020-07-15 DIAGNOSIS — M21962 Unspecified acquired deformity of left lower leg: Secondary | ICD-10-CM | POA: Diagnosis not present

## 2020-07-21 DIAGNOSIS — M545 Low back pain, unspecified: Secondary | ICD-10-CM | POA: Diagnosis not present

## 2020-07-21 DIAGNOSIS — M79672 Pain in left foot: Secondary | ICD-10-CM | POA: Diagnosis not present

## 2020-07-21 DIAGNOSIS — M79671 Pain in right foot: Secondary | ICD-10-CM | POA: Diagnosis not present

## 2020-07-21 DIAGNOSIS — M546 Pain in thoracic spine: Secondary | ICD-10-CM | POA: Diagnosis not present

## 2020-07-22 DIAGNOSIS — M546 Pain in thoracic spine: Secondary | ICD-10-CM | POA: Diagnosis not present

## 2020-07-22 DIAGNOSIS — M545 Low back pain, unspecified: Secondary | ICD-10-CM | POA: Diagnosis not present

## 2020-07-22 DIAGNOSIS — M79671 Pain in right foot: Secondary | ICD-10-CM | POA: Diagnosis not present

## 2020-07-22 DIAGNOSIS — M79672 Pain in left foot: Secondary | ICD-10-CM | POA: Diagnosis not present

## 2020-07-28 DIAGNOSIS — M545 Low back pain, unspecified: Secondary | ICD-10-CM | POA: Diagnosis not present

## 2020-07-28 DIAGNOSIS — M79671 Pain in right foot: Secondary | ICD-10-CM | POA: Diagnosis not present

## 2020-07-28 DIAGNOSIS — M79672 Pain in left foot: Secondary | ICD-10-CM | POA: Diagnosis not present

## 2020-07-28 DIAGNOSIS — M546 Pain in thoracic spine: Secondary | ICD-10-CM | POA: Diagnosis not present

## 2020-08-05 DIAGNOSIS — M8580 Other specified disorders of bone density and structure, unspecified site: Secondary | ICD-10-CM | POA: Diagnosis not present

## 2020-08-07 DIAGNOSIS — M79672 Pain in left foot: Secondary | ICD-10-CM | POA: Diagnosis not present

## 2020-08-07 DIAGNOSIS — M545 Low back pain, unspecified: Secondary | ICD-10-CM | POA: Diagnosis not present

## 2020-08-07 DIAGNOSIS — M79671 Pain in right foot: Secondary | ICD-10-CM | POA: Diagnosis not present

## 2020-08-07 DIAGNOSIS — M546 Pain in thoracic spine: Secondary | ICD-10-CM | POA: Diagnosis not present

## 2020-08-11 DIAGNOSIS — M545 Low back pain, unspecified: Secondary | ICD-10-CM | POA: Diagnosis not present

## 2020-08-11 DIAGNOSIS — M79672 Pain in left foot: Secondary | ICD-10-CM | POA: Diagnosis not present

## 2020-08-11 DIAGNOSIS — M546 Pain in thoracic spine: Secondary | ICD-10-CM | POA: Diagnosis not present

## 2020-08-11 DIAGNOSIS — M79671 Pain in right foot: Secondary | ICD-10-CM | POA: Diagnosis not present

## 2020-08-12 DIAGNOSIS — M21962 Unspecified acquired deformity of left lower leg: Secondary | ICD-10-CM | POA: Diagnosis not present

## 2020-08-12 DIAGNOSIS — M21961 Unspecified acquired deformity of right lower leg: Secondary | ICD-10-CM | POA: Diagnosis not present

## 2020-08-20 ENCOUNTER — Telehealth: Payer: Self-pay | Admitting: Internal Medicine

## 2020-08-20 NOTE — Telephone Encounter (Signed)
After speaking with DR Lenord Fellers scheduled for car visit

## 2020-08-20 NOTE — Telephone Encounter (Signed)
Kayla Sims [phone]  [name] called to say that she started getting sick on Monday, she has dep cough, nasal drip, congestion, sore throat, ears hurt. She stated she had been around her brother and sister in law that had been sick, they said they had the flu, but not diagnosed at the doctor. No COVID exposure that she knows of. Has had her COVID vaccines

## 2020-08-21 ENCOUNTER — Other Ambulatory Visit: Payer: Self-pay

## 2020-08-21 ENCOUNTER — Ambulatory Visit (INDEPENDENT_AMBULATORY_CARE_PROVIDER_SITE_OTHER): Payer: BC Managed Care – PPO | Admitting: Internal Medicine

## 2020-08-21 VITALS — HR 80 | Temp 98.7°F

## 2020-08-21 DIAGNOSIS — B348 Other viral infections of unspecified site: Secondary | ICD-10-CM | POA: Diagnosis not present

## 2020-08-21 DIAGNOSIS — R059 Cough, unspecified: Secondary | ICD-10-CM

## 2020-08-21 DIAGNOSIS — J029 Acute pharyngitis, unspecified: Secondary | ICD-10-CM

## 2020-08-21 DIAGNOSIS — J22 Unspecified acute lower respiratory infection: Secondary | ICD-10-CM | POA: Diagnosis not present

## 2020-08-21 LAB — POCT INFLUENZA A/B
Influenza A, POC: NEGATIVE
Influenza B, POC: NEGATIVE

## 2020-08-21 MED ORDER — BENZONATATE 100 MG PO CAPS
100.0000 mg | ORAL_CAPSULE | Freq: Three times a day (TID) | ORAL | 0 refills | Status: DC | PRN
Start: 2020-08-21 — End: 2023-12-06

## 2020-08-21 MED ORDER — AZITHROMYCIN 250 MG PO TABS
ORAL_TABLET | ORAL | 0 refills | Status: DC
Start: 2020-08-21 — End: 2023-12-06

## 2020-08-21 NOTE — Progress Notes (Signed)
   Subjective:    Patient ID: Kayla Sims, female    DOB: 1961/08/07, 59 y.o.   MRN: [id number]  HPI 59 year old Female seen for acute respiratory symptoms for a couple of days. Has not been Covid tested for this illness. Has cough with some sputum production, malaise, fatigue, scracthy throat, ear pain. Apparently has been around brother and sister-in-law that were sick. They told her they had the flu but not diagnosed at the doctor. She has no known Covid exposure that she is aware of and has had Covid vaccinations.    Review of Systems denies nausea vomiting dysgeusia fever     Objective:   Physical Exam Patient is afebrile. Sounds a bit hoarse and congested. TMs are clear. Pharynx is clear. Since she was told she had flu exposure. Rapid flu test was taken and was . COVID-19 test obtained and was negative. Respiratory virus panel obtained and was positive for rhinovirus. TMs are slightly full bilaterally but not red. Pharynx is slightly injected without exudate. Neck is supple. Chest clear to auscultation.       Assessment & Plan:  Acute bilateral serous otitis media  Acute viral syndrome-Influenza ruled out   Plan: Zithromax Z-PAK 2 p.o. day 1 followed by 1 p.o. days 2 through 5. Tessalon Perles 100 mg 3 times a day as needed for cough. Rest and drink plenty of fluids.   Await COVID-19 and respiratory virus panels  Addendum: Respiratory virus panel was positive for Rhinovirus and COVID-19 test is negative.

## 2020-08-22 LAB — RESPIRATORY VIRUS PANEL
Adenovirus B: NOT DETECTED
HUMAN PARAINFLU VIRUS 1: NOT DETECTED
HUMAN PARAINFLU VIRUS 2: NOT DETECTED
HUMAN PARAINFLU VIRUS 3: NOT DETECTED
INFLUENZA A SUBTYPE H1: NOT DETECTED
INFLUENZA A SUBTYPE H3: NOT DETECTED
Influenza A: NOT DETECTED
Influenza B: NOT DETECTED
Metapneumovirus: NOT DETECTED
Respiratory Syncytial Virus A: NOT DETECTED
Respiratory Syncytial Virus B: NOT DETECTED
Rhinovirus: DETECTED — AB

## 2020-08-22 LAB — SARS-COV-2 RNA,(COVID-19) QUALITATIVE NAAT: SARS CoV2 RNA: NOT DETECTED

## 2020-09-14 ENCOUNTER — Encounter: Payer: Self-pay | Admitting: Internal Medicine

## 2020-09-14 NOTE — Patient Instructions (Addendum)
Rest and drink plenty of fluids. Await COVID-19 and respiratory virus panels. Take Zithromax Z-PAK 2 p.o. day 1 followed by 1 p.o. days 2 through 5. Tessalon Perles 100 mg up to 3 times daily as needed for cough.  Addendum: Respiratory virus panel positive for rhinovirus which is common cold virus. Rapid strep screen is negative. COVID-19 test is negative.

## 2020-11-10 ENCOUNTER — Other Ambulatory Visit: Payer: Self-pay | Admitting: Adult Health

## 2020-11-14 ENCOUNTER — Telehealth: Payer: Self-pay | Admitting: Internal Medicine

## 2020-11-14 NOTE — Telephone Encounter (Signed)
Kayla Sims [phone]  Kayla Sims called to say she has a tickle in her throat for couple of weeks and has had congestion for about a week where she is having difficulty swalling and clearing her throat and because of the tickle she has a cough, she also said she has a sore on the back of her tongue. No fever, No COVID exposure, Had COVID Vaccine in November. She stated she orders her groceries online and does not go out.

## 2020-11-14 NOTE — Telephone Encounter (Signed)
Called to let patient know she needs to get COVID test at one of the testing centers, she really did not want to do that, so she is going to think about it and call back.

## 2020-11-14 NOTE — Telephone Encounter (Signed)
She needs to get a Covid test at testing center and we can do virtual visit today.

## 2020-12-03 ENCOUNTER — Encounter: Payer: Self-pay | Admitting: Internal Medicine

## 2021-03-10 ENCOUNTER — Other Ambulatory Visit: Payer: Self-pay | Admitting: Family Medicine

## 2021-03-10 DIAGNOSIS — R413 Other amnesia: Secondary | ICD-10-CM

## 2021-03-19 ENCOUNTER — Telehealth: Payer: Self-pay | Admitting: Internal Medicine

## 2021-03-19 DIAGNOSIS — Z0289 Encounter for other administrative examinations: Secondary | ICD-10-CM

## 2021-03-19 NOTE — Telephone Encounter (Signed)
Faxed 21 pages of Medical Records to Dr Hyacinth Meeker at [facility] medicine and removed Dr Lenord Fellers as PCP.

## 2021-04-04 ENCOUNTER — Other Ambulatory Visit: Payer: BC Managed Care – PPO

## 2021-06-11 ENCOUNTER — Other Ambulatory Visit: Payer: Self-pay | Admitting: Adult Health

## 2021-06-12 NOTE — Telephone Encounter (Signed)
I don't see any appt history for this pt only refills

## 2021-07-01 ENCOUNTER — Telehealth: Payer: Self-pay | Admitting: Adult Health

## 2021-07-01 NOTE — Telephone Encounter (Signed)
Pt called.  Her next appt is 9/26, but she only has 1 wk of meds left.  Would like to get the Rx of Lamotrigine refilled before her visit.  Refill to [facility].

## 2021-07-02 ENCOUNTER — Other Ambulatory Visit: Payer: Self-pay | Admitting: Adult Health

## 2021-07-02 ENCOUNTER — Other Ambulatory Visit: Payer: Self-pay

## 2021-07-02 MED ORDER — LAMOTRIGINE 100 MG PO TABS: ORAL_TABLET | ORAL | 0 refills | Status: DC

## 2021-07-02 NOTE — Telephone Encounter (Signed)
Ok to send

## 2021-07-02 NOTE — Telephone Encounter (Signed)
RX sent

## 2021-07-02 NOTE — Telephone Encounter (Signed)
Has apt next week but requesting her refill for Lamictal 100 mg take 2.5 tabs daily. She received a 90 day plus 1 refill in 10/2020. Not sure about office visits. Is it okay to send?

## 2021-07-13 ENCOUNTER — Ambulatory Visit: Payer: BC Managed Care – PPO | Admitting: Adult Health

## 2021-07-21 ENCOUNTER — Other Ambulatory Visit: Payer: Self-pay

## 2021-07-21 ENCOUNTER — Ambulatory Visit (INDEPENDENT_AMBULATORY_CARE_PROVIDER_SITE_OTHER): Payer: BC Managed Care – PPO | Admitting: Adult Health

## 2021-07-21 ENCOUNTER — Encounter: Payer: Self-pay | Admitting: Adult Health

## 2021-07-21 DIAGNOSIS — F39 Unspecified mood [affective] disorder: Secondary | ICD-10-CM

## 2021-07-21 DIAGNOSIS — F411 Generalized anxiety disorder: Secondary | ICD-10-CM | POA: Diagnosis not present

## 2021-07-21 MED ORDER — ALPRAZOLAM 0.25 MG PO TABS: 0.2500 mg | ORAL_TABLET | Freq: Every day | ORAL | 0 refills | Status: DC

## 2021-07-21 MED ORDER — LAMOTRIGINE 100 MG PO TABS: ORAL_TABLET | ORAL | 3 refills | Status: DC

## 2021-07-21 NOTE — Progress Notes (Signed)
Kayla Sims [id number] January 08, 1961 60 y.o.  Subjective:   Patient ID:  Kayla Sims is a 60 y.o. (DOB Nov 02, 1960) female.  Chief Complaint: No chief complaint on file.   HPI  Kayla Sims presents to the office today for follow-up of anxiety and mood disorder.  Describes mood today as "ok". Pleasant. Denies tearfulness. Mood symptoms - denies depression, anxiety, and irritability. Stating "I'm doing good". Feels like medications continue to work well. Stable interest and motivation. Taking medications as prescribed.  Energy levels stable. Active, does not have a regular exercise routine.  Enjoys some usual interests and activities. Married. Lives with husband -  3 grown children. Spending time with family. Appetite adequate. Weight stable. Sleeps well most nights. Averages 8 hours. Focus and concentration stable. Completing tasks. Managing aspects of household. Works full-time - Runner, broadcasting/film/video Denies SI or HI. Denies AH or VH.  Previous medication trials:  Unknown  PHQ2-9    Flowsheet Row Office Visit from 12/12/2017 in [facility], MD  PHQ-2 Total Score 0      Review of Systems:  Review of Systems  Musculoskeletal:  Negative for gait problem.  Neurological:  Negative for tremors.  Psychiatric/Behavioral:         Please refer to HPI   Medications: I have reviewed the patient's current medications.  Current Outpatient Medications  Medication Sig Dispense Refill   ALPRAZolam (XANAX) 0.25 MG tablet Take 1 tablet (0.25 mg total) by mouth daily. 30 tablet 0   azithromycin (ZITHROMAX) 250 MG tablet 2 po day 1 followed by one po days 2-5 6 tablet 0   benzonatate (TESSALON) 100 MG capsule Take 1 capsule (100 mg total) by mouth 3 (three) times daily as needed for cough. 30 capsule 0   doxycycline (VIBRA-TABS) 100 MG tablet Take 1 tablet (100 mg total) by mouth 2 (two) times daily. 14 tablet 0   ergocalciferol (DRISDOL) 50000 units capsule Take 1 capsule (50,000 Units  total) by mouth once a week. 12 capsule 0   ibuprofen (ADVIL,MOTRIN) 600 MG tablet Take 1 tablet (600 mg total) by mouth every 8 (eight) hours as needed. 90 tablet 1   lamoTRIgine (LAMICTAL) 100 MG tablet TAKE 2 AND 1/2 TABLETS DAILY 225 tablet 3   lamoTRIgine (LAMICTAL) 150 MG tablet Take 150 mg by mouth at bedtime.  5   Multiple Vitamin (MULTIVITAMIN) tablet Take 1 tablet by mouth daily.     mupirocin ointment (BACTROBAN) 2 % Place 1 application into the nose 2 (two) times daily. 22 g 0   No current facility-administered medications for this visit.    Medication Side Effects: None  Allergies:  Allergies  Allergen Reactions   Sulfa Antibiotics Rash    No past medical history on file.  Past Medical History, Surgical history, Social history, and Family history were reviewed and updated as appropriate.   Please see review of systems for further details on the patient's review from today.   Objective:   Physical Exam:  There were no vitals taken for this visit.  Physical Exam Constitutional:      General: Kayla Sims is not in acute distress. Musculoskeletal:        General: No deformity.  Neurological:     Mental Status: Kayla Sims is alert and oriented to person, place, and time.     Coordination: Coordination normal.  Psychiatric:        Attention and Perception: Attention and perception normal. Kayla Sims does not perceive auditory or visual hallucinations.  Mood and Affect: Mood normal. Mood is not anxious or depressed. Affect is not labile, blunt, angry or inappropriate.        Speech: Speech normal.        Behavior: Behavior normal.        Thought Content: Thought content normal. Thought content is not paranoid or delusional. Thought content does not include homicidal or suicidal ideation. Thought content does not include homicidal or suicidal plan.        Cognition and Memory: Cognition and memory normal.        Judgment: Judgment normal.     Comments: Insight intact    Lab  Review:     Component Value Date/Time   NA 138 12/08/2017 0942   K 4.4 12/08/2017 0942   CL 103 12/08/2017 0942   CO2 28 12/08/2017 0942   GLUCOSE 86 12/08/2017 0942   BUN 12 12/08/2017 0942   CREATININE 0.80 12/08/2017 0942   CALCIUM 9.4 12/08/2017 0942   PROT 6.4 12/08/2017 0942   AST 14 12/08/2017 0942   ALT 11 12/08/2017 0942   BILITOT 0.9 12/08/2017 0942   GFRNONAA 82 12/08/2017 0942   GFRAA 96 12/08/2017 0942       Component Value Date/Time   WBC 4.5 12/08/2017 0942   RBC 4.05 12/08/2017 0942   HGB 11.9 12/08/2017 0942   HCT 35.5 12/08/2017 0942   PLT 216 12/08/2017 0942   MCV 87.7 12/08/2017 0942   MCH 29.4 12/08/2017 0942   MCHC 33.5 12/08/2017 0942   RDW 12.8 12/08/2017 0942   LYMPHSABS 1,247 12/08/2017 0942   EOSABS 99 12/08/2017 0942   BASOSABS 9 12/08/2017 0942    No results found for: POCLITH, LITHIUM   No results found for: PHENYTOIN, PHENOBARB, VALPROATE, CBMZ   .res Assessment: Plan:    Plan:  PDMP reviewed  Lamictal 100mg  - 1 and 1/2 tabs in the morning and 1 tab in the evening. (May leave off the 1/2 tablet daily).  Patient seen for 30 minutes and time spent discussing treatment options.  RTC 4 weeks  Patient advised to contact office with any questions, adverse effects, or acute worsening in signs and symptoms.   Counseled patient regarding potential benefits, risks, and side effects of Lamictal to include potential risk of Stevens-Johnson syndrome. Advised patient to stop taking Lamictal and contact office immediately if rash develops and to seek urgent medical attention if rash is severe and/or spreading quickly.    Diagnoses and all orders for this visit:  Generalized anxiety disorder -     ALPRAZolam (XANAX) 0.25 MG tablet; Take 1 tablet (0.25 mg total) by mouth daily.  Episodic mood disorder (HCC) -     lamoTRIgine (LAMICTAL) 100 MG tablet; TAKE 2 AND 1/2 TABLETS DAILY    Please see After Visit Summary for patient specific  instructions.  Future Appointments  Date Time Provider Department Center  07/23/2022  4:20 PM Prabhjot Maddux, 09/22/2022, NP [facility] None    No orders of the defined types were placed in this encounter.   -------------------------------

## 2022-07-23 ENCOUNTER — Ambulatory Visit: Payer: BC Managed Care – PPO | Admitting: Adult Health

## 2022-10-07 ENCOUNTER — Other Ambulatory Visit: Payer: Self-pay | Admitting: Adult Health

## 2022-10-07 DIAGNOSIS — F39 Unspecified mood [affective] disorder: Secondary | ICD-10-CM

## 2022-11-03 ENCOUNTER — Encounter: Payer: Self-pay | Admitting: Adult Health

## 2022-11-03 ENCOUNTER — Ambulatory Visit: Payer: BC Managed Care – PPO | Admitting: Adult Health

## 2022-11-03 DIAGNOSIS — F39 Unspecified mood [affective] disorder: Secondary | ICD-10-CM | POA: Diagnosis not present

## 2022-11-03 DIAGNOSIS — F411 Generalized anxiety disorder: Secondary | ICD-10-CM

## 2022-11-03 MED ORDER — LAMOTRIGINE 100 MG PO TABS: ORAL_TABLET | ORAL | 3 refills | Status: DC

## 2022-11-03 MED ORDER — ALPRAZOLAM 0.25 MG PO TABS: 0.2500 mg | ORAL_TABLET | Freq: Every day | ORAL | 2 refills | Status: DC

## 2022-11-03 NOTE — Progress Notes (Signed)
Kayla Sims [id number] [birth year] 62 y.o.  Subjective:   Patient ID:  Kayla Sims is a 62 y.o. (DOB 1961/02/15) female.  Chief Complaint: No chief complaint on file.   HPI Kayla Sims presents to the office today for follow-up of anxiety and mood disorder.  Describes mood today as "ok". Pleasant. Denies tearfulness. Mood symptoms - denies depression, anxiety, and irritability. Mood is consistent. Stating "I'm doing good". Feels like medications continue to work well. Stable interest and motivation. Taking medications as prescribed.  Energy levels stable. Active, does not have a regular exercise routine.  Enjoys some usual interests and activities. Married. Lives with husband -  3 grown children. Spending time with family. Appetite adequate. Weight stable. Sleeps well most nights. Averages 8 hours. Focus and concentration stable. Completing tasks. Managing aspects of household. 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 Denies SI or HI. Denies AH or VH.  Previous medication trials:  Unknown  PHQ2-9    Reasnor Office Visit from 12/12/2017 in [facility], MD  PHQ-2 Total Score 0       Review of Systems:  Review of Systems  Musculoskeletal:  Negative for gait problem.  Neurological:  Negative for tremors.  Psychiatric/Behavioral:         Please refer to HPI    Medications: I have reviewed the patient's current medications.  Current Outpatient Medications  Medication Sig Dispense Refill   ALPRAZolam (XANAX) 0.25 MG tablet Take 1 tablet (0.25 mg total) by mouth daily. 30 tablet 2   azithromycin (ZITHROMAX) 250 MG tablet 2 po day 1 followed by one po days 2-5 6 tablet 0   benzonatate (TESSALON) 100 MG capsule Take 1 capsule (100 mg total) by mouth 3 (three) times daily as needed for cough. 30 capsule 0   doxycycline (VIBRA-TABS) 100 MG tablet Take 1 tablet (100 mg total) by mouth 2 (two) times daily. 14 tablet 0   ergocalciferol (DRISDOL) 50000 units  capsule Take 1 capsule (50,000 Units total) by mouth once a week. 12 capsule 0   ibuprofen (ADVIL,MOTRIN) 600 MG tablet Take 1 tablet (600 mg total) by mouth every 8 (eight) hours as needed. 90 tablet 1   lamoTRIgine (LAMICTAL) 100 MG tablet TAKE 2 AND 1/2 TABLETS BY MOUTH DAILY 225 tablet 3   lamoTRIgine (LAMICTAL) 150 MG tablet Take 150 mg by mouth at bedtime.  5   Multiple Vitamin (MULTIVITAMIN) tablet Take 1 tablet by mouth daily.     mupirocin ointment (BACTROBAN) 2 % Place 1 application into the nose 2 (two) times daily. 22 g 0   No current facility-administered medications for this visit.    Medication Side Effects: None  Allergies:  Allergies  Allergen Reactions   Sulfa Antibiotics Rash    No past medical history on file.  Past Medical History, Surgical history, Social history, and Family history were reviewed and updated as appropriate.   Please see review of systems for further details on the patient's review from today.   Objective:   Physical Exam:  There were no vitals taken for this visit.  Physical Exam Constitutional:      General: She is not in acute distress. Musculoskeletal:        General: No deformity.  Neurological:     Mental Status: She is alert and oriented to person, place, and time.     Coordination: Coordination normal.  Psychiatric:        Attention and Perception: Attention and perception  normal. She does not perceive auditory or visual hallucinations.        Mood and Affect: Mood normal. Mood is not anxious or depressed. Affect is not labile, blunt, angry or inappropriate.        Speech: Speech normal.        Behavior: Behavior normal.        Thought Content: Thought content normal. Thought content is not paranoid or delusional. Thought content does not include homicidal or suicidal ideation. Thought content does not include homicidal or suicidal plan.        Cognition and Memory: Cognition and memory normal.        Judgment: Judgment normal.      Comments: Insight intact     Lab Review:     Component Value Date/Time   NA 138 12/08/2017 0942   K 4.4 12/08/2017 0942   CL 103 12/08/2017 0942   CO2 28 12/08/2017 0942   GLUCOSE 86 12/08/2017 0942   BUN 12 12/08/2017 0942   CREATININE 0.80 12/08/2017 0942   CALCIUM 9.4 12/08/2017 0942   PROT 6.4 12/08/2017 0942   AST 14 12/08/2017 0942   ALT 11 12/08/2017 0942   BILITOT 0.9 12/08/2017 0942   GFRNONAA 82 12/08/2017 0942   GFRAA 96 12/08/2017 0942       Component Value Date/Time   WBC 4.5 12/08/2017 0942   RBC 4.05 12/08/2017 0942   HGB 11.9 12/08/2017 0942   HCT 35.5 12/08/2017 0942   PLT 216 12/08/2017 0942   MCV 87.7 12/08/2017 0942   MCH 29.4 12/08/2017 0942   MCHC 33.5 12/08/2017 0942   RDW 12.8 12/08/2017 0942   LYMPHSABS 1,247 12/08/2017 0942   EOSABS 99 12/08/2017 0942   BASOSABS 9 12/08/2017 0942    No results found for: "POCLITH", "LITHIUM"   No results found for: "PHENYTOIN", "PHENOBARB", "VALPROATE", "CBMZ"   .res Assessment: Plan:    Plan:  PDMP reviewed  Lamictal 100mg  - 1 and 1/2 tabs in the morning and 1 tab in the evening. (May leave off the 1/2 tablet daily).  Patient seen for 25 minutes and time spent discussing treatment options.  RTC 4 weeks  Patient advised to contact office with any questions, adverse effects, or acute worsening in signs and symptoms.   Counseled patient regarding potential benefits, risks, and side effects of Lamictal to include potential risk of Stevens-Johnson syndrome. Advised patient to stop taking Lamictal and contact office immediately if rash develops and to seek urgent medical attention if rash is severe and/or spreading quickly.  Diagnoses and all orders for this visit:  Generalized anxiety disorder -     ALPRAZolam (XANAX) 0.25 MG tablet; Take 1 tablet (0.25 mg total) by mouth daily.  Episodic mood disorder (HCC) -     lamoTRIgine (LAMICTAL) 100 MG tablet; TAKE 2 AND 1/2 TABLETS BY MOUTH DAILY      Please see After Visit Summary for patient specific instructions.  No future appointments.  No orders of the defined types were placed in this encounter.   -------------------------------

## 2022-12-29 ENCOUNTER — Ambulatory Visit: Payer: BC Managed Care – PPO | Admitting: Psychiatry

## 2023-04-18 ENCOUNTER — Telehealth: Payer: Self-pay | Admitting: Psychiatry

## 2023-04-18 NOTE — Telephone Encounter (Signed)
LVM and sent mychart msg informing pt of need to reschedule 05/16/23 appt - MD out 

## 2023-05-16 ENCOUNTER — Ambulatory Visit: Payer: BC Managed Care – PPO | Admitting: Psychiatry

## 2023-05-24 ENCOUNTER — Ambulatory Visit: Payer: BC Managed Care – PPO | Admitting: Neurology

## 2023-06-08 ENCOUNTER — Other Ambulatory Visit: Payer: Self-pay | Admitting: Adult Health

## 2023-06-08 DIAGNOSIS — F411 Generalized anxiety disorder: Secondary | ICD-10-CM

## 2023-08-10 ENCOUNTER — Encounter: Payer: Self-pay | Admitting: Neurology

## 2023-08-15 ENCOUNTER — Encounter: Payer: Self-pay | Admitting: Neurology

## 2023-08-15 ENCOUNTER — Ambulatory Visit: Payer: BC Managed Care – PPO | Admitting: Neurology

## 2023-09-23 ENCOUNTER — Telehealth: Payer: Self-pay | Admitting: Adult Health

## 2023-09-23 DIAGNOSIS — F39 Unspecified mood [affective] disorder: Secondary | ICD-10-CM

## 2023-09-23 NOTE — Telephone Encounter (Signed)
Patient called in for refill on Lamotrigine 100mg . Ph: [phone] Appt 1/17 [facility] [address] Jacky Kindle

## 2023-09-23 NOTE — Telephone Encounter (Signed)
[facility]

## 2023-11-04 ENCOUNTER — Ambulatory Visit: Payer: BC Managed Care – PPO | Admitting: Adult Health

## 2023-12-06 ENCOUNTER — Encounter: Payer: Self-pay | Admitting: Neurology

## 2023-12-06 ENCOUNTER — Telehealth: Payer: Self-pay | Admitting: Anesthesiology

## 2023-12-06 ENCOUNTER — Ambulatory Visit: Payer: 59 | Admitting: Neurology

## 2023-12-06 VITALS — BP 117/78 | HR 78 | Ht 66.0 in | Wt 127.0 lb

## 2023-12-06 DIAGNOSIS — R413 Other amnesia: Secondary | ICD-10-CM | POA: Diagnosis not present

## 2023-12-06 DIAGNOSIS — F39 Unspecified mood [affective] disorder: Secondary | ICD-10-CM | POA: Diagnosis not present

## 2023-12-06 NOTE — Progress Notes (Signed)
Chief Complaint  Patient presents with   New Patient (Initial Visit)    Pt in 14, Pt is referred for memory loss by Dr Hyacinth Meeker. Pt states she forgets appts, dates and at times she has a hard time finding her words.       ASSESSMENT AND PLAN  ELIANIE HUBERS is a 63 y.o. female Memory loss  MoCA examination was 30/30, she apparently has mild slow reaction time, this happened in the setting of long mood disorder,  Differentiation diagnosis include early onset central nervous system degenerative disorder, versus mood disorder related  MRI of the brain to rule out structural abnormality  Laboratory evaluation for treatable etiology  EEG  Refer her to neuropsychology evaluation  Return To Clinic With NP In 6 Months    DIAGNOSTIC DATA (LABS, IMAGING, TESTING) - I reviewed patient records, labs, notes, testing and imaging myself where available.   MEDICAL HISTORY:  Kayla Sims is a 63 year old female, seen in request by her primary care from [facility] Dr. Sigmund Hazel, for evaluation of memory loss, initial evaluation December 06, 2023    History is obtained from the patient and review of electronic medical records. I personally reviewed pertinent available imaging films in PACS.   PMHx of  Anxiety on Lamotrigine 250mg  daily x10 years, xanax once a month as needed.  She had long history of mood disorder, seeing psychiatrist, has been on stable dose of lamotrigine 250 mg daily for many years, reported that her mood disorder is under stable control, but complains of sleeping difficulty especially with recent stress, her 57 year old father just passed away 6 months ago, she has been traveling back and forth to visit her parents in [place]  She denies difficulty driving long distance, more and more rely on her GPS, but during the conversation with her friends and her husband, she was noted to have slow reaction time, difficulty recall details  She used to work as a Editor, commissioning,  later become Event organiser, 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, then she retired from that job for a while, missing the interaction, currently working 18 hours of week for [employer], coaching the student with math difficulties, but she has difficulty giving me exact timeline of her previous retirement,   She complains of feeling nervous frequently, taking Xanax sometimes, drink a glass of wine every night, often drink more during the weekend  She denies a family history of dementia, mildly decreased smell, " my smell is not as good as my husband"  PHYSICAL EXAM:   Vitals:   12/06/23 1531  BP: 117/78  Pulse: 78  Weight: 127 lb (57.6 kg)  Height: 5\' 6"  (1.676 m)   Not recorded     Body mass index is 20.5 kg/m.  PHYSICAL EXAMNIATION:  Gen: NAD, conversant, well nourised, well groomed                     Cardiovascular: Regular rate rhythm, no peripheral edema, warm, nontender. Eyes: Conjunctivae clear without exudates or hemorrhage Neck: Supple, no carotid bruits. Pulmonary: Clear to auscultation bilaterally   NEUROLOGICAL EXAM:  MENTAL STATUS: Speech/cognition: Anxious looking middle-age female, awake, alert, oriented to history taking and casual conversation    12/06/2023    3:32 PM  Montreal Cognitive Assessment   Visuospatial/ Executive (0/5) 5  Naming (0/3) 3  Attention: Read list of digits (0/2) 2  Attention: Read list of letters (0/1) 1  Attention: Serial 7 subtraction starting at 100 (0/3) 3  Language: Repeat phrase (0/2) 2  Language : Fluency (0/1) 1  Abstraction (0/2) 2  Delayed Recall (0/5) 5  Orientation (0/6) 6  Total 30    CRANIAL NERVES: CN II: Visual fields are full to confrontation. Pupils are round equal and briskly reactive to light. CN III, IV, VI: extraocular movement are normal. No ptosis. CN V: Facial sensation is intact to light touch CN VII: Face is symmetric with normal eye closure   CN VIII: Hearing is normal to causal conversation. CN IX, X: Phonation is normal. CN XI: Head turning and shoulder shrug are intact  MOTOR: There is no pronator drift of out-stretched arms. Muscle bulk and tone are normal. Muscle strength is normal.  REFLEXES: Reflexes are 2+ and symmetric at the biceps, triceps, knees, and ankles. Plantar responses are flexor.  SENSORY: Intact to light touch, pinprick and vibratory sensation are intact in fingers and toes.  COORDINATION: There is no trunk or limb dysmetria noted.  GAIT/STANCE: Posture is normal. Gait is steady with normal steps, base, arm swing, and turning. Heel and toe walking are normal. Tandem gait is normal.  Romberg is absent.  REVIEW OF SYSTEMS:  Full 14 system review of systems performed and notable only for as above All other review of systems were negative.   ALLERGIES: Allergies  Allergen Reactions   Sulfa Antibiotics Rash    HOME MEDICATIONS: Current Outpatient Medications  Medication Sig Dispense Refill   ALPRAZolam (XANAX) 0.25 MG tablet TAKE 1 TABLET BY MOUTH EVERY DAY 30 tablet 1   ergocalciferol (DRISDOL) 50000 units capsule Take 1 capsule (50,000 Units total) by mouth once a week. 12 capsule 0   ibuprofen (ADVIL,MOTRIN) 600 MG tablet Take 1 tablet (600 mg total) by mouth every 8 (eight) hours as needed. 90 tablet 1   lamoTRIgine (LAMICTAL) 100 MG tablet TAKE 2 AND 1/2 TABLETS BY MOUTH DAILY 225 tablet 1   lamoTRIgine (LAMICTAL) 150 MG tablet Take 150 mg by mouth at bedtime.  5   Multiple Vitamin (MULTIVITAMIN) tablet Take 1 tablet by mouth daily.     mupirocin ointment (BACTROBAN) 2 % Place 1 application into the nose 2 (two) times daily. (Patient not taking: Reported on 12/06/2023) 22 g 0   No current facility-administered medications for this visit.    PAST MEDICAL HISTORY: Past Medical History:  Diagnosis Date   ADHD    Anxiety     PAST SURGICAL HISTORY: Past Surgical History:  Procedure  Laterality Date   COLONOSCOPY  01/2020    FAMILY HISTORY: Family History  Problem Relation Age of Onset   Heart disease Mother    Cancer Sister 20       uterine    SOCIAL HISTORY: Social History   Socioeconomic History   Marital status: Married    Spouse name: Not on file   Number of children: Not on file   Years of education: Not on file   Highest education level: Not on file  Occupational History   Not on file  Tobacco Use   Smoking status: Never   Smokeless tobacco: Never  Vaping Use   Vaping status: Never Used  Substance and Sexual Activity   Alcohol use: Yes    Comment: glass of wine a day.   Drug use: No   Sexual activity: Not on file  Other Topics Concern   Not on file  Social History Narrative   Not on file  Social Drivers of Corporate investment banker Strain: Not on file  Food Insecurity: Not on file  Transportation Needs: Not on file  Physical Activity: Not on file  Stress: Not on file  Social Connections: Not on file  Intimate Partner Violence: Not on file       Levert Feinstein, M.D. Ph.D.  [facility] [address], [address] [place], [place] [address] Ph: [phone] Fax: [phone]  CC:  Sigmund Hazel, MD [address] [place],  [place] [address]  Sigmund Hazel, MD

## 2023-12-07 LAB — COMPREHENSIVE METABOLIC PANEL
ALT: 19 [IU]/L (ref 0–32)
AST: 15 [IU]/L (ref 0–40)
Albumin: 5.1 g/dL — ABNORMAL HIGH (ref 3.9–4.9)
Alkaline Phosphatase: 53 [IU]/L (ref 44–121)
BUN/Creatinine Ratio: 23 (ref 12–28)
BUN: 18 mg/dL (ref 8–27)
Bilirubin Total: 0.6 mg/dL (ref 0.0–1.2)
CO2: 22 mmol/L (ref 20–29)
Calcium: 9.9 mg/dL (ref 8.7–10.3)
Chloride: 102 mmol/L (ref 96–106)
Creatinine, Ser: 0.79 mg/dL (ref 0.57–1.00)
Globulin, Total: 1.7 g/dL (ref 1.5–4.5)
Glucose: 85 mg/dL (ref 70–99)
Potassium: 4.4 mmol/L (ref 3.5–5.2)
Sodium: 140 mmol/L (ref 134–144)
Total Protein: 6.8 g/dL (ref 6.0–8.5)
eGFR: 85 mL/min/{1.73_m2} (ref >59)

## 2023-12-07 LAB — LAMOTRIGINE LEVEL: Lamotrigine Lvl: 14.2 ug/mL (ref 2.0–20.0)

## 2023-12-07 LAB — CBC WITH DIFFERENTIAL/PLATELET
Basophils Absolute: 0 10*3/uL (ref 0.0–0.2)
Basos: 0 %
EOS (ABSOLUTE): 0.1 10*3/uL (ref 0.0–0.4)
Eos: 1 %
Hematocrit: 35.1 % (ref 34.0–46.6)
Hemoglobin: 12 g/dL (ref 11.1–15.9)
Immature Grans (Abs): 0 10*3/uL (ref 0.0–0.1)
Immature Granulocytes: 0 %
Lymphocytes Absolute: 1.6 10*3/uL (ref 0.7–3.1)
Lymphs: 28 %
MCH: 30.2 pg (ref 26.6–33.0)
MCHC: 34.2 g/dL (ref 31.5–35.7)
MCV: 88 fL (ref 79–97)
Monocytes Absolute: 0.4 10*3/uL (ref 0.1–0.9)
Monocytes: 7 %
Neutrophils Absolute: 3.8 10*3/uL (ref 1.4–7.0)
Neutrophils: 64 %
Platelets: 245 10*3/uL (ref 150–450)
RBC: 3.98 x10E6/uL (ref 3.77–5.28)
RDW: 12.2 % (ref 11.7–15.4)
WBC: 6 10*3/uL (ref 3.4–10.8)

## 2023-12-07 LAB — VITAMIN B12: Vitamin B-12: 935 pg/mL (ref 232–1245)

## 2023-12-07 LAB — TSH: TSH: 0.92 u[IU]/mL (ref 0.450–4.500)

## 2023-12-07 LAB — SEDIMENTATION RATE: Sed Rate: 12 mm/h (ref 0–40)

## 2023-12-07 LAB — RPR: RPR Ser Ql: NONREACTIVE

## 2023-12-07 LAB — C-REACTIVE PROTEIN: CRP: <1 mg/L (ref 0–10)

## 2023-12-08 ENCOUNTER — Telehealth: Payer: Self-pay | Admitting: Neurology

## 2023-12-08 NOTE — Telephone Encounter (Signed)
 Referral for neuropsychology fax to [facility]. [phone], Fax: [phone]

## 2023-12-09 ENCOUNTER — Ambulatory Visit: Payer: BC Managed Care – PPO | Admitting: Adult Health

## 2023-12-16 ENCOUNTER — Encounter: Payer: Self-pay | Admitting: Neurology

## 2023-12-26 ENCOUNTER — Other Ambulatory Visit: Payer: 59 | Admitting: *Deleted

## 2024-01-04 ENCOUNTER — Other Ambulatory Visit: Admitting: *Deleted

## 2024-01-05 ENCOUNTER — Ambulatory Visit: Admitting: Neurology

## 2024-01-05 DIAGNOSIS — R4182 Altered mental status, unspecified: Secondary | ICD-10-CM

## 2024-01-05 DIAGNOSIS — R413 Other amnesia: Secondary | ICD-10-CM

## 2024-01-05 DIAGNOSIS — F39 Unspecified mood [affective] disorder: Secondary | ICD-10-CM

## 2024-01-09 NOTE — Procedures (Signed)
   HISTORY: 63 year old female presenting with memory loss  TECHNIQUE:  This is a routine 16 channel EEG recording with one channel devoted to a limited EKG recording.  It was performed during wakefulness, drowsiness and asleep.  Hyperventilation and photic stimulation were performed as activating procedures.  There are minimum muscle and movement artifact noted.  Upon maximum arousal, posterior dominant waking rhythm consistent of rhythmic alpha range activity. Activities are symmetric over the bilateral posterior derivations and attenuated with eye opening.  Photic stimulation did not alter the tracing.  Hyperventilation produced mild/moderate buildup with higher amplitude and the slower activities noted.  During EEG recording, patient developed drowsiness and entered sleep, sleep EEG demonstrated architecture, there were frontal centrally dominant vertex waves and symmetric sleep spindles noted.  During EEG recording, there was no epileptiform discharge noted.  EKG demonstrate normal sinus rhythm.  CONCLUSION: This is a  normal awake and asleep EEG.  There is no electrodiagnostic evidence of epileptiform discharge.  Levert Feinstein, M.D. Ph.D.  [facility] [address] [place], [place] [address] Phone: [phone] Fax:      [phone]

## 2024-01-11 ENCOUNTER — Encounter: Payer: Self-pay | Admitting: Neurology

## 2024-03-02 ENCOUNTER — Encounter: Payer: Self-pay | Admitting: Adult Health

## 2024-03-02 ENCOUNTER — Ambulatory Visit: Payer: BC Managed Care – PPO | Admitting: Adult Health

## 2024-03-02 DIAGNOSIS — F411 Generalized anxiety disorder: Secondary | ICD-10-CM

## 2024-03-02 DIAGNOSIS — F39 Unspecified mood [affective] disorder: Secondary | ICD-10-CM | POA: Diagnosis not present

## 2024-03-02 MED ORDER — ALPRAZOLAM 0.25 MG PO TABS
0.2500 mg | ORAL_TABLET | Freq: Every day | ORAL | 1 refills | Status: AC
Start: 2024-03-02 — End: ?

## 2024-03-02 MED ORDER — LAMOTRIGINE 150 MG PO TABS: 150.0000 mg | ORAL_TABLET | Freq: Two times a day (BID) | ORAL | 1 refills | Status: DC

## 2024-03-02 MED ORDER — LAMOTRIGINE 150 MG PO TABS: 150.0000 mg | ORAL_TABLET | Freq: Two times a day (BID) | ORAL | 5 refills | Status: DC

## 2024-03-02 NOTE — Progress Notes (Signed)
 Kayla Sims [id number] Jun 06, 1961 63 y.o.  Subjective:   Patient ID:  Kayla Sims is a 63 y.o. (DOB 1960/10/26) female.  Chief Complaint: No chief complaint on file.   HPI Kayla Sims presents to the office today for follow-up of anxiety and mood disorder.  Describes mood today as "ok". Pleasant. Denies tearfulness. Mood symptoms - denies depression, anxiety and irritability. Reports lower interest and motivation. Denies panic attacks. Reports worry, rumination and over thinking - "more so lately". Reports father passed away last 08-15-24. Has been visiting with mother in [place] . Mood is variable - "feeling kind of blah". Stating "I'm not feeling happy". Feels like medications continue to work well. Reports lower interest and motivation. Taking medications as prescribed.  Energy levels lower - fatigues. Active, working on a regular exercise.  Unable to enjoy usual interests and activities. Married. Lives with husband -  3 grown children. Spending time with family. Appetite adequate. Weight stable. Sleeps well most nights. Averages 6 to 8 hours. Focus and concentration stable. Completing tasks. Managing aspects of household. 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 Denies SI or HI. Denies AH or VH. Denies self harm. Denies substance use.  Previous medication trials: Unknown   PHQ2-9    Flowsheet Row Office Visit from 12/12/2017 in [facility], MD  PHQ-2 Total Score 0        Review of Systems:  Review of Systems  Musculoskeletal:  Negative for gait problem.  Neurological:  Negative for tremors.  Psychiatric/Behavioral:         Please refer to HPI    Medications: I have reviewed the patient's current medications.  Current Outpatient Medications  Medication Sig Dispense Refill   ALPRAZolam  (XANAX ) 0.25 MG tablet TAKE 1 TABLET BY MOUTH EVERY DAY 30 tablet 1   ergocalciferol  (DRISDOL ) 50000 units capsule Take 1 capsule (50,000 Units total) by mouth once a  week. 12 capsule 0   ibuprofen  (ADVIL ,MOTRIN ) 600 MG tablet Take 1 tablet (600 mg total) by mouth every 8 (eight) hours as needed. 90 tablet 1   lamoTRIgine  (LAMICTAL ) 100 MG tablet TAKE 2 AND 1/2 TABLETS BY MOUTH DAILY 225 tablet 1   lamoTRIgine  (LAMICTAL ) 150 MG tablet Take 150 mg by mouth at bedtime.  5   Multiple Vitamin (MULTIVITAMIN) tablet Take 1 tablet by mouth daily.     mupirocin  ointment (BACTROBAN ) 2 % Place 1 application into the nose 2 (two) times daily. (Patient not taking: Reported on 12/06/2023) 22 g 0   No current facility-administered medications for this visit.    Medication Side Effects: None  Allergies:  Allergies  Allergen Reactions   Sulfa Antibiotics Rash    Past Medical History:  Diagnosis Date   ADHD    Anxiety     Past Medical History, Surgical history, Social history, and Family history were reviewed and updated as appropriate.   Please see review of systems for further details on the patient's review from today.   Objective:   Physical Exam:  There were no vitals taken for this visit.  Physical Exam Constitutional:      General: She is not in acute distress. Musculoskeletal:        General: No deformity.  Neurological:     Mental Status: She is alert and oriented to person, place, and time.     Coordination: Coordination normal.  Psychiatric:        Attention and Perception: Attention and perception normal. She does not perceive auditory  or visual hallucinations.        Mood and Affect: Mood normal. Mood is not anxious or depressed. Affect is not labile, blunt, angry or inappropriate.        Speech: Speech normal.        Behavior: Behavior normal.        Thought Content: Thought content normal. Thought content is not paranoid or delusional. Thought content does not include homicidal or suicidal ideation. Thought content does not include homicidal or suicidal plan.        Cognition and Memory: Cognition and memory normal.        Judgment:  Judgment normal.     Comments: Insight intact     Lab Review:     Component Value Date/Time   NA 140 12/06/2023 1629   K 4.4 12/06/2023 1629   CL 102 12/06/2023 1629   CO2 22 12/06/2023 1629   GLUCOSE 85 12/06/2023 1629   GLUCOSE 86 12/08/2017 0942   BUN 18 12/06/2023 1629   CREATININE 0.79 12/06/2023 1629   CREATININE 0.80 12/08/2017 0942   CALCIUM 9.9 12/06/2023 1629   PROT 6.8 12/06/2023 1629   ALBUMIN 5.1 (H) 12/06/2023 1629   AST 15 12/06/2023 1629   ALT 19 12/06/2023 1629   ALKPHOS 53 12/06/2023 1629   BILITOT 0.6 12/06/2023 1629   GFRNONAA 82 12/08/2017 0942   GFRAA 96 12/08/2017 0942       Component Value Date/Time   WBC 6.0 12/06/2023 1629   WBC 4.5 12/08/2017 0942   RBC 3.98 12/06/2023 1629   RBC 4.05 12/08/2017 0942   HGB 12.0 12/06/2023 1629   HCT 35.1 12/06/2023 1629   PLT 245 12/06/2023 1629   MCV 88 12/06/2023 1629   MCH 30.2 12/06/2023 1629   MCH 29.4 12/08/2017 0942   MCHC 34.2 12/06/2023 1629   MCHC 33.5 12/08/2017 0942   RDW 12.2 12/06/2023 1629   LYMPHSABS 1.6 12/06/2023 1629   EOSABS 0.1 12/06/2023 1629   BASOSABS 0.0 12/06/2023 1629    No results found for: "POCLITH", "LITHIUM"   No results found for: "PHENYTOIN", "PHENOBARB", "VALPROATE", "CBMZ"   .res Assessment: Plan:    Plan:  PDMP reviewed  Increase Lamictal  150mg  in the morning and 100mg  at bedtime to 150mg  BID.  RTC 4 weeks  20 minutes spent dedicated to the care of this patient on the date of this encounter to include pre-visit review of records, ordering of medication, post visit documentation, and face-to-face time with the patient discussing episodic mood disorder and anxiety. Discussed increasing Lamictal  dose.   Counseled patient regarding potential benefits, risks, and side effects of Lamictal  to include potential risk of Stevens-Johnson syndrome. Advised patient to stop taking Lamictal  and contact office immediately if rash develops and to seek urgent medical  attention if rash is severe and/or spreading quickly.   Patient advised to contact office with any questions, adverse effects, or acute worsening in signs and symptoms.    There are no diagnoses linked to this encounter.   Please see After Visit Summary for patient specific instructions.  Future Appointments  Date Time Provider Department Center  06/04/2024  1:30 PM Phebe Brasil, MD [facility] None    No orders of the defined types were placed in this encounter.   -------------------------------

## 2024-04-03 ENCOUNTER — Other Ambulatory Visit: Payer: Self-pay | Admitting: Adult Health

## 2024-04-03 DIAGNOSIS — F39 Unspecified mood [affective] disorder: Secondary | ICD-10-CM

## 2024-05-16 ENCOUNTER — Ambulatory Visit
Admission: RE | Admit: 2024-05-16 | Discharge: 2024-05-16 | Disposition: A | Discharge status: Home / Self Care | Source: Ambulatory Visit | Attending: Family Medicine | Admitting: Family Medicine

## 2024-05-16 ENCOUNTER — Other Ambulatory Visit: Payer: Self-pay | Admitting: Family Medicine

## 2024-05-16 ENCOUNTER — Encounter: Payer: Self-pay | Admitting: Family Medicine

## 2024-05-16 DIAGNOSIS — S0990XA Unspecified injury of head, initial encounter: Secondary | ICD-10-CM

## 2024-06-04 ENCOUNTER — Ambulatory Visit: Payer: 59 | Admitting: Neurology

## 2024-09-03 ENCOUNTER — Ambulatory Visit: Admitting: Adult Health

## 2024-09-28 ENCOUNTER — Other Ambulatory Visit: Payer: Self-pay

## 2024-09-28 ENCOUNTER — Encounter (HOSPITAL_COMMUNITY): Payer: Self-pay

## 2024-09-28 ENCOUNTER — Emergency Department (HOSPITAL_COMMUNITY)

## 2024-09-28 ENCOUNTER — Emergency Department (HOSPITAL_COMMUNITY)
Admission: EM | Admit: 2024-09-28 | Discharge: 2024-09-28 | Disposition: A | Discharge status: Home / Self Care | Attending: Emergency Medicine | Admitting: Emergency Medicine

## 2024-09-28 DIAGNOSIS — R260 Ataxic gait: Secondary | ICD-10-CM | POA: Diagnosis not present

## 2024-09-28 DIAGNOSIS — Z79899 Other long term (current) drug therapy: Secondary | ICD-10-CM | POA: Insufficient documentation

## 2024-09-28 DIAGNOSIS — R42 Dizziness and giddiness: Secondary | ICD-10-CM | POA: Diagnosis present

## 2024-09-28 DIAGNOSIS — R791 Abnormal coagulation profile: Secondary | ICD-10-CM | POA: Diagnosis not present

## 2024-09-28 LAB — COMPREHENSIVE METABOLIC PANEL WITH GFR
ALT: 18 U/L (ref 0–44)
AST: 17 U/L (ref 15–41)
Albumin: 4.3 g/dL (ref 3.5–5.0)
Alkaline Phosphatase: 40 U/L (ref 38–126)
Anion gap: 8 (ref 5–15)
BUN: 12 mg/dL (ref 8–23)
CO2: 31 mmol/L (ref 22–32)
Calcium: 9.9 mg/dL (ref 8.9–10.3)
Chloride: 99 mmol/L (ref 98–111)
Creatinine, Ser: 0.92 mg/dL (ref 0.44–1.00)
GFR, Estimated: >60 mL/min (ref >60)
Glucose, Bld: 103 mg/dL — ABNORMAL HIGH (ref 70–99)
Potassium: 5.3 mmol/L — ABNORMAL HIGH (ref 3.5–5.1)
Sodium: 138 mmol/L (ref 135–145)
Total Bilirubin: 0.9 mg/dL (ref 0.0–1.2)
Total Protein: 6.7 g/dL (ref 6.5–8.1)

## 2024-09-28 LAB — CBC
HCT: 34.3 % — ABNORMAL LOW (ref 36.0–46.0)
Hemoglobin: 11.1 g/dL — ABNORMAL LOW (ref 12.0–15.0)
MCH: 29.8 pg (ref 26.0–34.0)
MCHC: 32.4 g/dL (ref 30.0–36.0)
MCV: 92.2 fL (ref 80.0–100.0)
Platelets: 207 K/uL (ref 150–400)
RBC: 3.72 MIL/uL — ABNORMAL LOW (ref 3.87–5.11)
RDW: 13.1 % (ref 11.5–15.5)
WBC: 4.2 K/uL (ref 4.0–10.5)
nRBC: 0 % (ref 0.0–0.2)

## 2024-09-28 LAB — CBC WITH DIFFERENTIAL/PLATELET
Abs Immature Granulocytes: 0.01 K/uL (ref 0.00–0.07)
Basophils Absolute: 0 K/uL (ref 0.0–0.1)
Basophils Relative: 0 %
Eosinophils Absolute: 0.1 K/uL (ref 0.0–0.5)
Eosinophils Relative: 1 %
HCT: 37 % (ref 36.0–46.0)
Hemoglobin: 12.2 g/dL (ref 12.0–15.0)
Immature Granulocytes: 0 %
Lymphocytes Relative: 32 %
Lymphs Abs: 1.5 K/uL (ref 0.7–4.0)
MCH: 30.4 pg (ref 26.0–34.0)
MCHC: 33 g/dL (ref 30.0–36.0)
MCV: 92.3 fL (ref 80.0–100.0)
Monocytes Absolute: 0.5 K/uL (ref 0.1–1.0)
Monocytes Relative: 10 %
Neutro Abs: 2.5 K/uL (ref 1.7–7.7)
Neutrophils Relative %: 57 %
Platelets: 218 K/uL (ref 150–400)
RBC: 4.01 MIL/uL (ref 3.87–5.11)
RDW: 13 % (ref 11.5–15.5)
WBC: 4.5 K/uL (ref 4.0–10.5)
nRBC: 0 % (ref 0.0–0.2)

## 2024-09-28 LAB — I-STAT CHEM 8, ED
BUN: 15 mg/dL (ref 8–23)
Calcium, Ion: 1.15 mmol/L (ref 1.15–1.40)
Chloride: 98 mmol/L (ref 98–111)
Creatinine, Ser: 1 mg/dL (ref 0.44–1.00)
Glucose, Bld: 92 mg/dL (ref 70–99)
HCT: 33 % — ABNORMAL LOW (ref 36.0–46.0)
Hemoglobin: 11.2 g/dL — ABNORMAL LOW (ref 12.0–15.0)
Potassium: 4.1 mmol/L (ref 3.5–5.1)
Sodium: 135 mmol/L (ref 135–145)
TCO2: 29 mmol/L (ref 22–32)

## 2024-09-28 LAB — RAPID URINE DRUG SCREEN, HOSP PERFORMED
Amphetamines: NOT DETECTED
Barbiturates: NOT DETECTED
Benzodiazepines: NOT DETECTED
Cocaine: NOT DETECTED
Opiates: NOT DETECTED
Tetrahydrocannabinol: NOT DETECTED

## 2024-09-28 LAB — DIFFERENTIAL
Abs Immature Granulocytes: 0.01 K/uL (ref 0.00–0.07)
Basophils Absolute: 0 K/uL (ref 0.0–0.1)
Basophils Relative: 0 %
Eosinophils Absolute: 0 K/uL (ref 0.0–0.5)
Eosinophils Relative: 1 %
Immature Granulocytes: 0 %
Lymphocytes Relative: 25 %
Lymphs Abs: 1.1 K/uL (ref 0.7–4.0)
Monocytes Absolute: 0.4 K/uL (ref 0.1–1.0)
Monocytes Relative: 9 %
Neutro Abs: 2.7 K/uL (ref 1.7–7.7)
Neutrophils Relative %: 65 %

## 2024-09-28 LAB — MAGNESIUM: Magnesium: 2.2 mg/dL (ref 1.7–2.4)

## 2024-09-28 LAB — ETHANOL: Alcohol, Ethyl (B): <15 mg/dL (ref <15)

## 2024-09-28 LAB — APTT: aPTT: 29 s (ref 24–36)

## 2024-09-28 LAB — PROTIME-INR
INR: 1.1 (ref 0.8–1.2)
Prothrombin Time: 14.7 s (ref 11.4–15.2)

## 2024-09-28 MED ORDER — SODIUM CHLORIDE 0.9 % IV BOLUS
1000.0000 mL | Freq: Once | INTRAVENOUS | Status: AC
  Administered 2024-09-28: 1000 mL via INTRAVENOUS

## 2024-09-28 MED ORDER — IOHEXOL 350 MG/ML SOLN
75.0000 mL | Freq: Once | INTRAVENOUS | Status: AC | PRN
  Administered 2024-09-28: 75 mL via INTRAVENOUS

## 2024-09-28 MED ORDER — DIAZEPAM 5 MG/ML IJ SOLN: 5.0000 mg | Freq: Once | INTRAMUSCULAR | Status: DC

## 2024-09-28 MED ORDER — LORAZEPAM 2 MG/ML IJ SOLN
INTRAMUSCULAR | Status: AC
  Filled 2024-09-28: qty 1

## 2024-09-28 MED ORDER — MECLIZINE HCL 25 MG PO TABS: 25.0000 mg | ORAL_TABLET | Freq: Three times a day (TID) | ORAL | 0 refills | Status: AC | PRN

## 2024-09-28 MED ORDER — LORAZEPAM 2 MG/ML IJ SOLN
1.0000 mg | Freq: Once | INTRAMUSCULAR | Status: AC
  Administered 2024-09-28: 1 mg via INTRAVENOUS

## 2024-09-28 MED ORDER — MECLIZINE HCL 25 MG PO TABS
25.0000 mg | ORAL_TABLET | Freq: Once | ORAL | Status: AC
  Administered 2024-09-28: 25 mg via ORAL
  Filled 2024-09-28: qty 1

## 2024-09-28 NOTE — ED Notes (Signed)
 Code stroke activated by Dr Cleotilde, pt taken to CT with this RN at this time

## 2024-09-28 NOTE — ED Provider Notes (Signed)
 [place] EMERGENCY DEPARTMENT AT [facility] Provider Note   CSN: [id number] Arrival date & time: 09/28/24  9091  An emergency department physician performed an initial assessment on this suspected stroke patient at 1005.  Patient presents with: Dizziness   Kayla Sims is a 63 y.o. female with past medical history significant for depression with anxiety who presents with sudden onset of dizziness / ataxia at 0830. No unilateral deficits. Awoke feeling normal. Symptoms began after taking shower very suddenly. No visual changes.     Dizziness      Prior to Admission medications  Medication Sig Start Date End Date Taking? Authorizing Provider  meclizine  (ANTIVERT ) 25 MG tablet Take 1 tablet (25 mg total) by mouth 3 (three) times daily as needed for dizziness. 09/28/24  Yes Bernard Slayden H, PA-C  ALPRAZolam  (XANAX ) 0.25 MG tablet Take 1 tablet (0.25 mg total) by mouth daily. 03/02/24   Mozingo, Regina Nattalie, NP  ergocalciferol  (DRISDOL ) 50000 units capsule Take 1 capsule (50,000 Units total) by mouth once a week. 12/12/17   Perri Ronal PARAS, MD  ibuprofen  (ADVIL ,MOTRIN ) 600 MG tablet Take 1 tablet (600 mg total) by mouth every 8 (eight) hours as needed. 11/23/13   Joshua Debby CROME, MD  lamoTRIgine  (LAMICTAL ) 150 MG tablet Take 1 tablet (150 mg total) by mouth 2 (two) times daily. 03/02/24   Mozingo, Regina Nattalie, NP  Multiple Vitamin (MULTIVITAMIN) tablet Take 1 tablet by mouth daily.    [provider]  mupirocin  ointment (BACTROBAN ) 2 % Place 1 application into the nose 2 (two) times daily. Patient not taking: Reported on 12/06/2023 11/26/19   Perri Ronal PARAS, MD  FLUoxetine  (PROZAC ) 10 MG capsule Take 1 capsule (10 mg total) by mouth daily. 11/23/11 01/11/12  Aron, Talia M, MD    Allergies: Sulfa antibiotics    Review of Systems  Neurological:  Positive for dizziness.  All other systems reviewed and are negative.   Updated Vital Signs BP 105/69 (BP  Location: Right Arm)   Pulse 65   Temp (!) 97.5 F (36.4 C) (Oral)   Resp 13   Ht 5' 7 (1.702 m)   Wt 56.7 kg   SpO2 100%   BMI 19.58 kg/m   Physical Exam Vitals and nursing note reviewed.  Constitutional:      General: She is not in acute distress.    Appearance: Normal appearance.  HENT:     Head: Normocephalic and atraumatic.  Eyes:     General:        Right eye: No discharge.        Left eye: No discharge.  Cardiovascular:     Rate and Rhythm: Normal rate and regular rhythm.     Heart sounds: No murmur heard.    No friction rub. No gallop.  Pulmonary:     Effort: Pulmonary effort is normal.     Breath sounds: Normal breath sounds.  Abdominal:     General: Bowel sounds are normal.     Palpations: Abdomen is soft.  Skin:    General: Skin is warm and dry.     Capillary Refill: Capillary refill takes less than 2 seconds.  Neurological:     Mental Status: She is alert and oriented to person, place, and time.     Comments: Finger nose finger ataxic bilaterally Otherwise: Cranial nerves II through XII grossly intact. Romberg and gait mildly ataxic. Alert and oriented x3.  Moves all 4 limbs spontaneously.  No pronator drift.  Intact strength 5 out of 5 bilateral upper and lower extremities.    Psychiatric:        Mood and Affect: Mood normal.        Behavior: Behavior normal.     (all labs ordered are listed, but only abnormal results are displayed) Labs Reviewed  COMPREHENSIVE METABOLIC PANEL WITH GFR - Abnormal; Notable for the following components:      Result Value   Potassium 5.3 (*)    Glucose, Bld 103 (*)    All other components within normal limits  CBC - Abnormal; Notable for the following components:   RBC 3.72 (*)    Hemoglobin 11.1 (*)    HCT 34.3 (*)    All other components within normal limits  I-STAT CHEM 8, ED - Abnormal; Notable for the following components:   Hemoglobin 11.2 (*)    HCT 33.0 (*)    All other components within normal limits   CBC WITH DIFFERENTIAL/PLATELET  MAGNESIUM  DIFFERENTIAL  ETHANOL  RAPID URINE DRUG SCREEN, HOSP PERFORMED  COMPREHENSIVE METABOLIC PANEL WITH GFR  PROTIME-INR  APTT    EKG: EKG Interpretation Date/Time:  Friday September 28 2024 09:22:03 EST Ventricular Rate:  81 PR Interval:  162 QRS Duration:  92 QT Interval:  356 QTC Calculation: 413 R Axis:   67  Text Interpretation: Normal sinus rhythm Normal ECG No previous ECGs available Confirmed by Cleotilde Rogue ([id number]) on 09/28/2024 9:45:21 AM  Radiology: MR BRAIN WO CONTRAST Result Date: 09/28/2024 EXAM: MRI BRAIN WITHOUT CONTRAST 09/28/2024 11:03:00 AM TECHNIQUE: Multiplanar multisequence MRI of the head/brain was performed without the administration of intravenous contrast. COMPARISON: CT head 09/28/2024 at 10:11 AM. CLINICAL HISTORY: Neuro deficit, acute, stroke suspected. FINDINGS: BRAIN AND VENTRICLES: No acute infarct. No intracranial hemorrhage. No mass. No midline shift. No hydrocephalus. Faint susceptibility in the region of the right basal ganglia may relate to mineralization or the sequela of prior micro hemorrhage. The sella is unremarkable. Normal flow voids. ORBITS: No acute abnormality. SINUSES AND MASTOIDS: No acute abnormality. BONES AND SOFT TISSUES: Normal marrow signal. No acute soft tissue abnormality. IMPRESSION: 1. No acute intracranial hemorrhage or acute infarction. Electronically signed by: Prentice Spade 09/28/2024 11:33 AM EST RP Workstation: [id number]   CT ANGIO HEAD NECK W WO CM (CODE STROKE) Result Date: 09/28/2024 EXAM: CTA HEAD AND NECK WITHOUT AND WITH 09/28/2024 10:30:12 AM TECHNIQUE: CTA of the head and neck was performed without and with the administration of 75 mL of iohexol  (OMNIPAQUE ) 350 MG/ML injection. Multiplanar 2D and/or 3D reformatted images are provided for review. Automated exposure control, iterative reconstruction, and/or weight based adjustment of the mA/kV was utilized to reduce the  radiation dose to as low as reasonably achievable. Stenosis of the internal carotid arteries measured using NASCET criteria. COMPARISON: CT head 09/28/2024 reported separately. CLINICAL HISTORY: 63 year old female with acute neuro deficit, stroke suspected. FINDINGS: CTA NECK: AORTIC ARCH AND ARCH VESSELS: Proximal great vessel detail obscured by dense left subclavian and predominant venous contrast streak artifact. There appears to be a normal 3 vessel arch and no arch atherosclerosis is evident. Soft plaque at the right subclavian artery origin without stenosis. CAROTID ARTERIES: Right CCA origin is visible and normal. Negative right carotid bifurcation. Mildly tortuous cervical right ICA. Negative right ICA siphon. No right carotid stenosis. Some of the proximal left CCA is obscured but the visible left CCA, left carotid bifurcation are negative. Mildly tortuous left ICA distal to the bulb. Patent and negative left ICA siphon. No  left carotid stenosis. Normal left posterior communicating artery origin. No dissection, arterial injury, or hemodynamically significant stenosis by NASCET criteria. VERTEBRAL ARTERIES: Normal right vertebral artery origin. No right vertebral artery atherosclerosis or stenosis. Normal left vertebral artery origin. Left vertebral artery is mildly dominant. No left vertebral artery atherosclerosis or stenosis. Normal vertebrobasilar junction. No dissection, arterial injury, or significant stenosis. LUNGS AND MEDIASTINUM: Negative visible upper chest. SOFT TISSUES: Negative for age nonvascular neck soft tissue spaces. BONES: Cervical spine disc and endplate degeneration, advanced at C5-C6. Small C7 cervical ribs, normal variant. No acute osseous abnormality. CTA HEAD: ANTERIOR CIRCULATION: No significant stenosis of the internal carotid arteries. Normal MCA and ACA origins. Normal anterior communicating artery. No stenosis. No aneurysm. POSTERIOR CIRCULATION: Normal vertebrobasilar  junction. Bilateral AICA appear dominant. Normal SCA and right PCA origins. Fetal type left PCA origin, normal variant. No stenosis of the posterior cerebral arteries. No stenosis of the basilar artery. No stenosis of the vertebral arteries. No aneurysm. OTHER: Mild earring artifact. Major dural venous sinuses are enhancing and appear patent. IMPRESSION: 1. No large vessel occlusion. Minimal atherosclerosis in the head and neck with no stenosis. 2. These results were communicated to Dr. Arora at 10:43 on 09/28/2024 by text page via the [facility] messaging system. Electronically signed by: Helayne Hurst MD 09/28/2024 10:45 AM EST RP Workstation: [id number]   CT HEAD CODE STROKE WO CONTRAST (LKW 0-4.5h, LVO 0-24h) Result Date: 09/28/2024 EXAM: CT HEAD WITHOUT CONTRAST 05/19/2023 TECHNIQUE: CT of the head was performed without the administration of intravenous contrast. Automated exposure control, iterative reconstruction, and/or weight based adjustment of the mA/kV was utilized to reduce the radiation dose to as low as reasonably achievable. COMPARISON: CT Head 05/17/2023. CLINICAL HISTORY: 63 year old female with acute stroke presentation. FINDINGS: BRAIN AND VENTRICLES: No acute hemorrhage. No evidence of acute infarct. No hydrocephalus. No extra-axial collection. No mass effect or midline shift. Stable and normal brain volume per age. Stable and normal gray white differentiation for age. Mild for age calcified atherosclerosis at the skull base. No suspicious intracranial vascular hyperdensity. ORBITS: No acute abnormality. SINUSES: Visible paranasal sinuses, tympanic cavities and mastoids remain clear. SOFT TISSUES AND SKULL: No acute soft tissue abnormality. No skull fracture. [place] stroke program early CT score (aspects) ----- Ganglionic (caudate, ic, lentiform nucleus, insula, M1-m3): 7 Supraganglionic (m4-m6): 3 Total: 10 IMPRESSION: 1. Normal for age non-contrast CT appearance of the brain.  ASPECTS 10. 2.  These results were communicated to Dr. Arora at 1021 hours on 05/16/2022 by text page via the [facility] messaging system. Electronically signed by: Helayne Hurst MD 09/28/2024 10:22 AM EST RP Workstation: [id number]     Procedures   Medications Ordered in the ED  LORazepam  (ATIVAN ) injection 1 mg (1 mg Intravenous Given 09/28/24 1038)  iohexol  (OMNIPAQUE ) 350 MG/ML injection 75 mL (75 mLs Intravenous Contrast Given 09/28/24 1027)  sodium chloride  0.9 % bolus 1,000 mL (0 mLs Intravenous Stopped 09/28/24 1301)  meclizine  (ANTIVERT ) tablet 25 mg (25 mg Oral Given 09/28/24 1124)                                    Medical Decision Making  This patient is a 63 y.o. female  who presents to the ED for concern of code stroke, dizziness, ataxia .   Differential diagnoses prior to evaluation: The emergent differential diagnosis includes, but is not limited to,  BPPV, vestibular migraine, head trauma, AVM, intracranial tumor,  multiple sclerosis, drug-related, CVA, orthostatic hypotension, sepsis, hypoglycemia, electrolyte disturbance, anemia, anxiety . This is not an exhaustive differential.   Past Medical History / Co-morbidities / Social History: depression with anxiety  Physical Exam: Physical exam performed. The pertinent findings include: Finger nose finger ataxic bilaterally Otherwise: Cranial nerves II through XII grossly intact. Romberg and gait mildly ataxic. Alert and oriented x3.  Moves all 4 limbs spontaneously.  No pronator drift.  Intact strength 5 out of 5 bilateral upper and lower extremities.  VSS other than elevated 159/94  Lab Tests/Imaging studies: I personally interpreted labs/imaging and the pertinent results include: CBC unremarkable, CMP notable for hyperkalemia, calcium 5.3 although normal on i-STAT at 4.1, wonder if there was some degree of hemolysis on the metabolic panel.  Magnesium is 2.2.  CT head, angio, MR brain all without any evidence of stroke or other intracranial  abnormality.  I agree with the radiologist interpretation.  Cardiac monitoring: EKG obtained and interpreted by myself and attending physician which shows: NSR   Medications: I ordered medication including bolus, antivert , ativan .  I have reviewed the patients home medicines and have made adjustments as needed.  On reassessment patient had normal finger-nose-finger, normal non-ataxic gait.   Disposition: After consideration of the diagnostic results and the patients response to treatment, I feel that patient stable for discharge with plan as above, vertigo today may have been secondary to BPPV.   emergency department workup does not suggest an emergent condition requiring admission or immediate intervention beyond what has been performed at this time. The plan is: as above. The patient is safe for discharge and has been instructed to return immediately for worsening symptoms, change in symptoms or any other concerns.   Final diagnoses:  Dizziness    ED Discharge Orders          Ordered    meclizine  (ANTIVERT ) 25 MG tablet  3 times daily PRN        09/28/24 1350               Brayley Mackowiak, Murray H, PA-C 09/28/24 1353    Tegeler, Lonni PARAS, MD 09/29/24 1210

## 2024-09-28 NOTE — Code Documentation (Addendum)
 Stroke Response Nurse Documentation Code Documentation  Kayla Sims is a 63 y.o. female arriving to [place]  via Private Vehicle on 09/28/2024 with past medical hx of depression, anxiety. On No antithrombotic. Code stroke was activated by ED.   Patient from home where she was LKW at 0830 and now complaining of dizziness/unable to walk. Per patient, she woke up in her normal state of health, took a shower, got ready for the day and and an acute onset of dizziness/unable to walk at 0830  Stroke team at the bedside on patient arrival. Labs drawn and patient cleared for CT by EDP. Patient to CT with team. NIHSS 2, see documentation for details and code stroke times. Patient with bilateral limb ataxia on exam. The following imaging was completed:  CT Head, CTA, and MRI. Patient is not a candidate for IV Thrombolytic due to no stroke noted on imaging per MD. Patient is not a candidate for IR due to no LVO noted on imaging per MD.   Care Plan: VS/NIHSS q30 until out of window at 1300; NPO until swallow screen passed.  Update: Code Stroke Canceled at 1146 per MD  Bedside handoff with ED RN Sara/Priya.    Kayla Sims  Stroke Response RN

## 2024-09-28 NOTE — H&P (Signed)
 NEUROLOGY H&P NOTE   Date of service: September 28, 2024 Patient Name: Kayla Sims MRN:  [id number] DOB:  [birth year] Chief Complaint: Gait ataxia Requesting provider: Redell Pinal, MD  History of Present Illness  Kayla Sims is a 63 y.o. female with hx of anxiety, presents to the emergency department for evaluation of sudden onset of gait instability and dizziness.  She woke up this morning at 6 AM normal, right around 8:30 AM, which we are going with the last known well time, is when she had sudden onset of gait unsteadiness.  She found it hard to walk without holding onto things and felt as if she was stumbling.  She was brought into the emergency department.  The ED provider reached out to me for next steps and I recommended a code stroke be activated for the concern for a posterior circulation stroke based on my discussion with the ED provider Dr. Pinal.    Last known well: 8:30 AM Modified rankin score: 0-Completely asymptomatic and back to baseline post- stroke IV Thrombolysis: MRI negative for stroke Thrombectomy: No LVO ICH Score NIHSS components Score: Comment  1a Level of Conscious 0[]  1[]  2[]  3[]      1b LOC Questions 0[]  1[]  2[]       1c LOC Commands 0[]  1[]  2[]       2 Best Gaze 0[]  1[]  2[]       3 Visual 0[]  1[]  2[]  3[]      4 Facial Palsy 0[]  1[]  2[]  3[]      5a Motor Arm - left 0[]  1[]  2[]  3[]  4[]  UN[]    5b Motor Arm - Right 0[]  1[]  2[]  3[]  4[]  UN[]    6a Motor Leg - Left 0[]  1[]  2[]  3[]  4[]  UN[]    6b Motor Leg - Right 0[]  1[]  2[]  3[]  4[]  UN[]    7 Limb Ataxia 0[]  1[]  2[x]  UN[]      8 Sensory 0[]  1[]  2[]  UN[]      9 Best Language 0[]  1[]  2[]  3[]      10 Dysarthria 0[]  1[]  2[]  UN[]      11 Extinct. and Inattention 0[]  1[]  2[]       TOTAL: 2      ROS  Cough ROS performed and negative except as noted in HPI  Past History   Past Medical History:  Diagnosis Date   ADHD    Anxiety    Past Surgical History:  Procedure Laterality Date   COLONOSCOPY  01/2020    Family History  Problem Relation Age of Onset   Heart disease Mother    Cancer Sister 60       uterine   Social History   Socioeconomic History   Marital status: Married    Spouse name: Not on file   Number of children: Not on file   Years of education: Not on file   Highest education level: Not on file  Occupational History   Not on file  Tobacco Use   Smoking status: Never   Smokeless tobacco: Never  Vaping Use   Vaping status: Never Used  Substance and Sexual Activity   Alcohol use: Yes    Comment: glass of wine a day.   Drug use: No   Sexual activity: Not on file  Other Topics Concern   Not on file  Social History Narrative   Not on file   Social Drivers of Health   Tobacco Use: Low Risk (09/28/2024)   Patient History    Smoking Tobacco Use: Never    Smokeless  Tobacco Use: Never    Passive Exposure: Not on file  Financial Resource Strain: Not on file  Food Insecurity: Not on file  Transportation Needs: Not on file  Physical Activity: Not on file  Stress: Not on file  Social Connections: Not on file  Depression (EYV7-0): Not on file  Alcohol Screen: Not on file  Housing: Not on file  Utilities: Not on file  Health Literacy: Not on file   Allergies[1]  Medications  Current Medications[2]   Vitals   Vitals:   09/28/24 0915 09/28/24 0926  BP: (!) 159/94   Pulse: 86   Resp: 17   Temp: 98.1 F (36.7 C)   SpO2: 100%   Weight:  56.7 kg  Height:  5' 7 (1.702 m)     Body mass index is 19.58 kg/m.  Physical Exam   General: Awake alert in no distress HEENT: Normocephalic atraumatic  Clear  Neurologic Examination   Awake alert anxious looking No dysarthria No aphasia Cranial nerves II to XII: Pupils equal round react light, extraocular movements with mild nystagmus on leftward gaze with the fast component to the right, no restriction of extraocular movements, visual fields full, facial sensation intact, face symmetric, tongue and palate  midline. Motor examination with no drift in any of the 4 extremities Sensation intact without extinction Coordination exam with dysmetria on finger-nose-finger testing bilaterally and on heel-knee-shin testing on the right Extremely ataxic on attempting to walk  Labs   CBC    Component Value Date/Time   WBC 4.2 09/28/2024 1000   RBC 3.72 (L) 09/28/2024 1000   HGB 11.2 (L) 09/28/2024 1103   HGB 12.0 12/06/2023 1629   HCT 33.0 (L) 09/28/2024 1103   HCT 35.1 12/06/2023 1629   PLT 207 09/28/2024 1000   PLT 245 12/06/2023 1629   MCV 92.2 09/28/2024 1000   MCV 88 12/06/2023 1629   MCH 29.8 09/28/2024 1000   MCHC 32.4 09/28/2024 1000   RDW 13.1 09/28/2024 1000   RDW 12.2 12/06/2023 1629   LYMPHSABS 1.1 09/28/2024 1000   LYMPHSABS 1.6 12/06/2023 1629   MONOABS 0.4 09/28/2024 1000   EOSABS 0.0 09/28/2024 1000   EOSABS 0.1 12/06/2023 1629   BASOSABS 0.0 09/28/2024 1000   BASOSABS 0.0 12/06/2023 1629      Latest Ref Rng & Units 09/28/2024   11:03 AM 09/28/2024    9:45 AM 12/06/2023    4:29 PM  BMP  Glucose 70 - 99 mg/dL 92  896  85   BUN 8 - 23 mg/dL 15  12  18    Creatinine 0.44 - 1.00 mg/dL 8.99  9.07  9.20   BUN/Creat Ratio 12 - 28   23   Sodium 135 - 145 mmol/L 135  138  140   Potassium 3.5 - 5.1 mmol/L 4.1  5.3  4.4   Chloride 98 - 111 mmol/L 98  99  102   CO2 22 - 32 mmol/L  31  22   Calcium 8.9 - 10.3 mg/dL  9.9  9.9       CT Head without contrast(Personally reviewed): Aspects 10.  No bleed.  CT angio Head and Neck with contrast(Personally reviewed): No ELVO   MRI Brain(Personally reviewed): No no acute intracranial abnormality  Assessment   Kayla Sims is a 63 y.o. female past history of anxiety who presents with sudden onset of gait ataxia.  On examination she has bilateral finger-nose-finger ataxia, gait ataxia. Extensively discussed that this may be a secondary  to a peripheral etiology such as BPPV or posterior circulation stroke and recommended  consideration of IV TNKase.  After lengthy discussion of risks benefits and alternatives, both patient and husband decided to proceed for an MRI first before deciding for TNKase. Taken for stat MRI of the brain-personally reviewed, which was negative for acute process.  Impression Dizziness and ataxia-likely secondary to peripheral etiology  Recommendations  Hydration with IV fluids IV diazepam Consider meclizine Outpatient neurology/ENT follow-up if desired Inpatient neurology will sign off and will be available as needed. Plan was discussed with Dr. Ginger ______________________________________________________________________   Signed, Eligio Lav, MD [facility]    [1]  Allergies Allergen Reactions   Sulfa Antibiotics Rash  [2] No current facility-administered medications for this encounter.  Current Outpatient Medications:    ALPRAZolam  (XANAX ) 0.25 MG tablet, Take 1 tablet (0.25 mg total) by mouth daily., Disp: 30 tablet, Rfl: 1   ergocalciferol  (DRISDOL ) 50000 units capsule, Take 1 capsule (50,000 Units total) by mouth once a week., Disp: 12 capsule, Rfl: 0   ibuprofen  (ADVIL ,MOTRIN ) 600 MG tablet, Take 1 tablet (600 mg total) by mouth every 8 (eight) hours as needed., Disp: 90 tablet, Rfl: 1   lamoTRIgine  (LAMICTAL ) 150 MG tablet, Take 1 tablet (150 mg total) by mouth 2 (two) times daily., Disp: 180 tablet, Rfl: 1   Multiple Vitamin (MULTIVITAMIN) tablet, Take 1 tablet by mouth daily., Disp: , Rfl:    mupirocin  ointment (BACTROBAN ) 2 %, Place 1 application into the nose 2 (two) times daily. (Patient not taking: Reported on 12/06/2023), Disp: 22 g, Rfl: 0

## 2024-09-28 NOTE — ED Triage Notes (Signed)
 Pt c.o sudden onset of dizziness about 1 hour ago and feels off balance. Pt denies headache, nausea or vision changes. No neuro deficits noted on exam.

## 2024-09-28 NOTE — ED Provider Triage Note (Signed)
 Emergency Medicine Provider Triage Evaluation Note  Kayla Sims , a 62 y.o. female  was evaluated in triage.  Pt complains of dizziness that came on acutely at 8:30 AM, the patient reports a feeling of imbalance, she has a feeling of discoordination of her arms legs and when she walks like she has to hold onto something, she has no vertigo, she has no headache, no chest pain or shortness of breath.  No other significant changes, takes no medications for diabetes.,  Review of the patient's medications reveals that she takes Lamictal , Xanax .  Review of Systems  Positive: Dizziness, Negative: No changes in vision or speech, no chest pain or shortness of breath, no weakness or numbness  Physical Exam  BP (!) 159/94   Pulse 86   Temp 98.1 F (36.7 C)   Resp 17   Ht 1.702 m (5' 7)   Wt 56.7 kg   SpO2 100%   BMI 19.58 kg/m  Gen:   Awake, no distress   Resp:  Normal effort  MSK:   Moves extremities without difficulty  Other:  Neurologically the patient has slightly off balance, when she stands up she stumbles from side-to-side both front and back as well.  Her cranial nerves III through XII appear to be normal, visual acuity is normal, peripheral visual fields are unremarkable, she has normal strength in all 4 extremities and normal finger-nose-finger by coordination.  Medical Decision Making  Medically screening exam initiated at 9:45 AM.  Appropriate orders placed.  Kayla Sims was informed that the remainder of the evaluation will be completed by another provider, this initial triage assessment does not replace that evaluation, and the importance of remaining in the ED until their evaluation is complete.  Isolated feeling of imbalance, was discussed with neurology, labs ordered,   Cleotilde Rogue, MD 09/28/24 [phone]

## 2024-09-28 NOTE — Discharge Instructions (Addendum)
 You can use the antivert medication as needed up to every 6 hours as needed for dizziness. You can try the Epley maneuver which is designed to reposition stones inside the ear which may be what is causing the episode of dizziness you had today. You can follow up with the ENT doctor whose contact information I provided above.

## 2024-09-28 NOTE — ED Notes (Signed)
Pt was able to ambulate with a steady gait.

## 2024-10-30 ENCOUNTER — Encounter: Payer: Self-pay | Admitting: Neurology

## 2024-10-30 ENCOUNTER — Ambulatory Visit: Payer: Self-pay | Admitting: Neurology

## 2024-10-30 VITALS — BP 124/80 | HR 71 | Ht 66.5 in | Wt 131.5 lb

## 2024-10-30 DIAGNOSIS — R413 Other amnesia: Secondary | ICD-10-CM | POA: Diagnosis not present

## 2024-10-30 DIAGNOSIS — F39 Unspecified mood [affective] disorder: Secondary | ICD-10-CM | POA: Diagnosis not present

## 2024-10-30 NOTE — Progress Notes (Signed)
 "  Chief Complaint  Patient presents with   Follow-up    Pt in room 14. Alone. Here for memory follow up. MOCA:29      ASSESSMENT AND PLAN  Kayla Sims is a 64 y.o. female Memory loss, intermittent functional difficulty  MoCA examination was 30/30,   Neuropsychology testing by Dr. Marykay at [facility] in October 2025 also showed no significant abnormality  MRI of the brain was normal  EEG was normal  CT angiogram head and neck showed no large vessel disease  Laboratory evaluation showed no treatable etiology  I do think her current complaint related to her difficulty focusing, underlying mood disorder, suggested her continue to work with her primary care, psychologist, potential  psychology counseling      DIAGNOSTIC DATA (LABS, IMAGING, TESTING) - I reviewed patient records, labs, notes, testing and imaging myself where available.   MEDICAL HISTORY:  Kayla Sims is a 64 year old female, seen in request by her primary care from [facility] Dr. Cleotilde Planas, for evaluation of memory loss, initial evaluation December 06, 2023    History is obtained from the patient and review of electronic medical records. I personally reviewed pertinent available imaging films in PACS.   PMHx of  Anxiety on Lamotrigine  250mg  daily x10 years, xanax  once a month as needed.  She had long history of mood disorder, seeing psychiatrist, has been on stable dose of lamotrigine  250 mg daily for many years, reported that her mood disorder is under stable control, but complains of sleeping difficulty especially with recent stress, her 38 year old father just passed away 6 months ago, she has been traveling back and forth to visit her parents in [place]   She denies difficulty driving long distance, more and more rely on her GPS, but during the conversation with her friends and her husband, she was noted to have slow reaction time, difficulty recall details  She used to work as a editor, commissioning,  later become event organiser, travel around the state to to microbiologist for classroom strategy, then she retired from that job for a while, missing the interaction, currently working 18 hours of week for [employer], coaching the student with math difficulties, but she has difficulty giving me exact timeline of her previous retirement,   She complains of feeling nervous frequently, taking Xanax  sometimes, drink a glass of wine every night, often drink more during the weekend  She denies a family history of dementia, mildly decreased smell,  my smell is not as good as my husband  UPDATE Oct 30 2024: She is alone at today's visit, continues to complain struggle handling her daily activity, she is a scientific laboratory technician, sometimes she is confused about the numbers, position of the decimal  She was treated at emergency room September 28, 2024, had acute onset of unsteady gait had extensive evaluation,  MRI of the brain was normal  CT angiogram head and neck showed no large vessel disease  I also reviewed neuropsychology evaluation by Dr. Marykay  from [facility] in October 2025, reported intact functioning across all thinking skills assessed with only minor reduction on 2 selective measures, likely subclinical in nature.  This concern was raised in the context of longstanding history of ADD  Extensive laboratory evaluation in 2025 showed no treatable etiology including normal negative CBC CMP C-reactive protein ESR TSH RPR B12, negative alcohol level UDS, lamotrigine  was 14.2  She is seeing psychologist for anxiety, mood disorder, on lamotrigine  100 mg twice a  day, Xanax  as needed  EEG was normal   PHYSICAL EXAM:   Vitals:   10/30/24 0858  BP: 124/80  Pulse: 71  Weight: 131 lb 8 oz (59.6 kg)  Height: 5' 6.5 (1.689 m)     Body mass index is 20.91 kg/m.  PHYSICAL EXAMNIATION:  Gen: NAD, conversant, well nourised, well groomed                      Cardiovascular: Regular rate rhythm, no peripheral edema, warm, nontender. Eyes: Conjunctivae clear without exudates or hemorrhage Neck: Supple, no carotid bruits. Pulmonary: Clear to auscultation bilaterally   NEUROLOGICAL EXAM:  MENTAL STATUS: Speech/cognition: Anxious looking middle-age female, awake, alert, oriented to history taking and casual conversation    10/30/2024    9:00 AM 12/06/2023    3:32 PM  Montreal Cognitive Assessment   Visuospatial/ Executive (0/5) 5 5  Naming (0/3) 3 3  Attention: Read list of digits (0/2) 2 2  Attention: Read list of letters (0/1) 1 1  Attention: Serial 7 subtraction starting at 100 (0/3) 3 3  Language: Repeat phrase (0/2) 2 2  Language : Fluency (0/1) 1 1  Abstraction (0/2) 2 2  Delayed Recall (0/5) 5 5  Orientation (0/6) 5 6  Total 29 30    CRANIAL NERVES: CN II: Visual fields are full to confrontation. Pupils are round equal and briskly reactive to light. CN III, IV, VI: extraocular movement are normal. No ptosis. CN V: Facial sensation is intact to light touch CN VII: Face is symmetric with normal eye closure  CN VIII: Hearing is normal to causal conversation. CN IX, X: Phonation is normal. CN XI: Head turning and shoulder shrug are intact  MOTOR: There is no pronator drift of out-stretched arms. Muscle bulk and tone are normal. Muscle strength is normal.  REFLEXES: Reflexes are 2+ and symmetric at the biceps, triceps, knees, and ankles. Plantar responses are flexor.  SENSORY: Intact to light touch, pinprick and vibratory sensation are intact in fingers and toes.  COORDINATION: There is no trunk or limb dysmetria noted.  GAIT/STANCE: Posture is normal. Gait is steady    REVIEW OF SYSTEMS:  Full 14 system review of systems performed and notable only for as above All other review of systems were negative.   ALLERGIES: Allergies  Allergen Reactions   Sulfa Antibiotics Rash    HOME MEDICATIONS: Current Outpatient  Medications  Medication Sig Dispense Refill   ALPRAZolam  (XANAX ) 0.25 MG tablet Take 1 tablet (0.25 mg total) by mouth daily. (Patient taking differently: Take 0.25 mg by mouth daily. As needed only) 30 tablet 1   ergocalciferol  (DRISDOL ) 50000 units capsule Take 1 capsule (50,000 Units total) by mouth once a week. 12 capsule 0   ibuprofen  (ADVIL ,MOTRIN ) 600 MG tablet Take 1 tablet (600 mg total) by mouth every 8 (eight) hours as needed. 90 tablet 1   lamoTRIgine  (LAMICTAL ) 150 MG tablet Take 1 tablet (150 mg total) by mouth 2 (two) times daily. 180 tablet 1   Multiple Vitamin (MULTIVITAMIN) tablet Take 1 tablet by mouth daily.     meclizine  (ANTIVERT ) 25 MG tablet Take 1 tablet (25 mg total) by mouth 3 (three) times daily as needed for dizziness. (Patient not taking: Reported on 10/30/2024) 30 tablet 0   mupirocin  ointment (BACTROBAN ) 2 % Place 1 application into the nose 2 (two) times daily. (Patient not taking: Reported on 10/30/2024) 22 g 0   No current facility-administered medications for this visit.  PAST MEDICAL HISTORY: Past Medical History:  Diagnosis Date   ADHD    Anxiety     PAST SURGICAL HISTORY: Past Surgical History:  Procedure Laterality Date   COLONOSCOPY  01/2020    FAMILY HISTORY: Family History  Problem Relation Age of Onset   Heart disease Mother    Cancer Sister 41       uterine    SOCIAL HISTORY: Social History   Socioeconomic History   Marital status: Married    Spouse name: Not on file   Number of children: Not on file   Years of education: Not on file   Highest education level: Not on file  Occupational History   Not on file  Tobacco Use   Smoking status: Never   Smokeless tobacco: Never  Vaping Use   Vaping status: Never Used  Substance and Sexual Activity   Alcohol use: Yes    Comment: glass of wine a day.   Drug use: No   Sexual activity: Not on file  Other Topics Concern   Not on file  Social History Narrative   Not on file    Social Drivers of Health   Tobacco Use: Low Risk (10/30/2024)   Patient History    Smoking Tobacco Use: Never    Smokeless Tobacco Use: Never    Passive Exposure: Not on file  Financial Resource Strain: Not on file  Food Insecurity: Not on file  Transportation Needs: Not on file  Physical Activity: Not on file  Stress: Not on file  Social Connections: Not on file  Intimate Partner Violence: Not on file  Depression (EYV7-0): Not on file  Alcohol Screen: Not on file  Housing: Not on file  Utilities: Not on file  Health Literacy: Not on file       Modena Callander, M.D. Ph.D.  [facility] [address] [place], [place] [address] Ph: [phone] Fax: [phone]  CC:  Callander Modena, MD [address] [place],  [place] [address]  Cleotilde Planas, MD   "

## 2024-11-11 ENCOUNTER — Other Ambulatory Visit: Payer: Self-pay | Admitting: Adult Health

## 2024-11-11 DIAGNOSIS — F39 Unspecified mood [affective] disorder: Secondary | ICD-10-CM

## 2024-11-12 NOTE — Telephone Encounter (Signed)
 Past due. Sent MyChart message to schedule.
